# Patient Record
Sex: Female | Born: 1940 | Race: White | Hispanic: No | Marital: Married | Smoking: Current every day smoker
Health system: Southern US, Community
[De-identification: ages and names within clinical notes are randomized; demographics above are authoritative.]

## PROBLEM LIST (undated history)

## (undated) DIAGNOSIS — T8859XA Other complications of anesthesia, initial encounter: Secondary | ICD-10-CM

## (undated) DIAGNOSIS — E039 Hypothyroidism, unspecified: Secondary | ICD-10-CM

## (undated) DIAGNOSIS — I2510 Atherosclerotic heart disease of native coronary artery without angina pectoris: Secondary | ICD-10-CM

## (undated) DIAGNOSIS — E079 Disorder of thyroid, unspecified: Secondary | ICD-10-CM

## (undated) HISTORY — PX: APPENDECTOMY: SHX54

## (undated) HISTORY — PX: ABDOMINAL HYSTERECTOMY: SHX81

---

## 2004-03-09 ENCOUNTER — Ambulatory Visit: Payer: Self-pay | Admitting: Family Medicine

## 2009-06-23 ENCOUNTER — Encounter: Admission: RE | Admit: 2009-06-23 | Discharge: 2009-06-23 | Payer: Self-pay | Admitting: Family Medicine

## 2011-03-23 ENCOUNTER — Ambulatory Visit (INDEPENDENT_AMBULATORY_CARE_PROVIDER_SITE_OTHER): Payer: Medicare Other | Admitting: Urology

## 2011-03-23 DIAGNOSIS — R82998 Other abnormal findings in urine: Secondary | ICD-10-CM

## 2011-03-23 DIAGNOSIS — N952 Postmenopausal atrophic vaginitis: Secondary | ICD-10-CM

## 2011-07-27 ENCOUNTER — Ambulatory Visit (INDEPENDENT_AMBULATORY_CARE_PROVIDER_SITE_OTHER): Payer: Medicare Other | Admitting: Urology

## 2011-07-27 DIAGNOSIS — R300 Dysuria: Secondary | ICD-10-CM

## 2011-07-27 DIAGNOSIS — N952 Postmenopausal atrophic vaginitis: Secondary | ICD-10-CM

## 2011-07-27 DIAGNOSIS — R82998 Other abnormal findings in urine: Secondary | ICD-10-CM

## 2016-06-21 ENCOUNTER — Other Ambulatory Visit (HOSPITAL_COMMUNITY): Payer: Self-pay | Admitting: Family Medicine

## 2016-06-21 DIAGNOSIS — Z780 Asymptomatic menopausal state: Secondary | ICD-10-CM

## 2016-06-30 ENCOUNTER — Ambulatory Visit (HOSPITAL_COMMUNITY)
Admission: RE | Admit: 2016-06-30 | Discharge: 2016-06-30 | Disposition: A | Discharge status: Home / Self Care | Payer: Medicare Other | Source: Ambulatory Visit | Attending: Family Medicine | Admitting: Family Medicine

## 2016-06-30 ENCOUNTER — Encounter (HOSPITAL_COMMUNITY): Payer: Self-pay | Admitting: Radiology

## 2016-06-30 DIAGNOSIS — M810 Age-related osteoporosis without current pathological fracture: Secondary | ICD-10-CM | POA: Insufficient documentation

## 2016-06-30 DIAGNOSIS — Z780 Asymptomatic menopausal state: Secondary | ICD-10-CM | POA: Diagnosis present

## 2016-10-11 ENCOUNTER — Ambulatory Visit (HOSPITAL_COMMUNITY)
Admission: RE | Admit: 2016-10-11 | Discharge: 2016-10-11 | Disposition: A | Discharge status: Home / Self Care | Payer: Medicare Other | Source: Ambulatory Visit | Attending: Family Medicine | Admitting: Family Medicine

## 2016-10-11 ENCOUNTER — Other Ambulatory Visit (HOSPITAL_COMMUNITY): Payer: Self-pay | Admitting: Family Medicine

## 2016-10-11 DIAGNOSIS — M79671 Pain in right foot: Secondary | ICD-10-CM | POA: Insufficient documentation

## 2016-10-11 DIAGNOSIS — R52 Pain, unspecified: Secondary | ICD-10-CM

## 2016-10-11 IMAGING — DX DG FOOT 2V*R*
2 series · 2 of 2 positions shown · non-contrast
Comparison: None in PACs

CLINICAL DATA: Pain and redness along the lateral aspect of the
great toe for the past 5 days.

EXAM:
RIGHT FOOT - 2 VIEW

[foot ap]
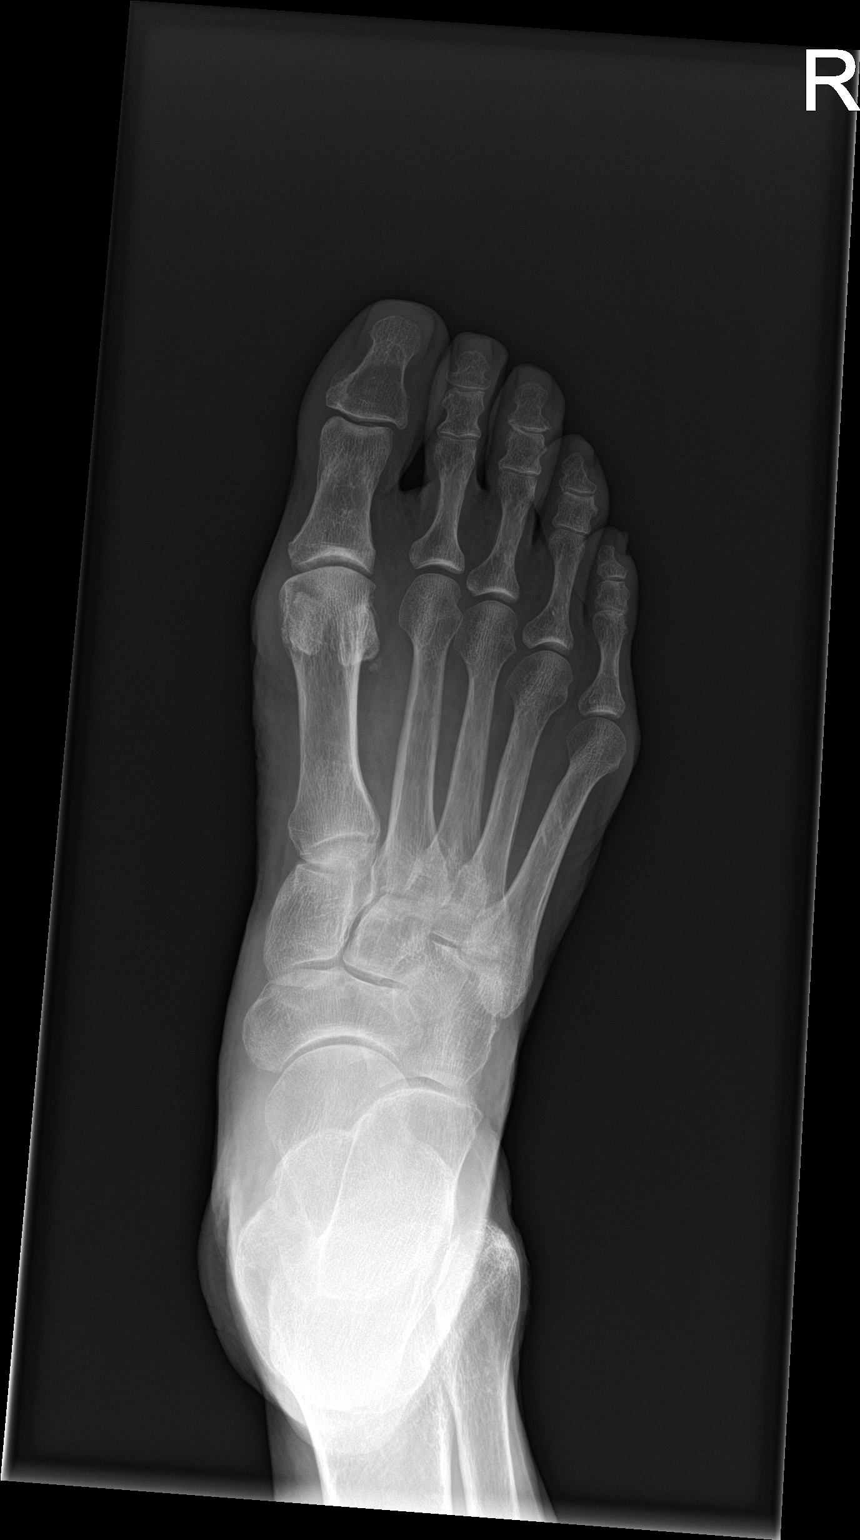

[foot lat]
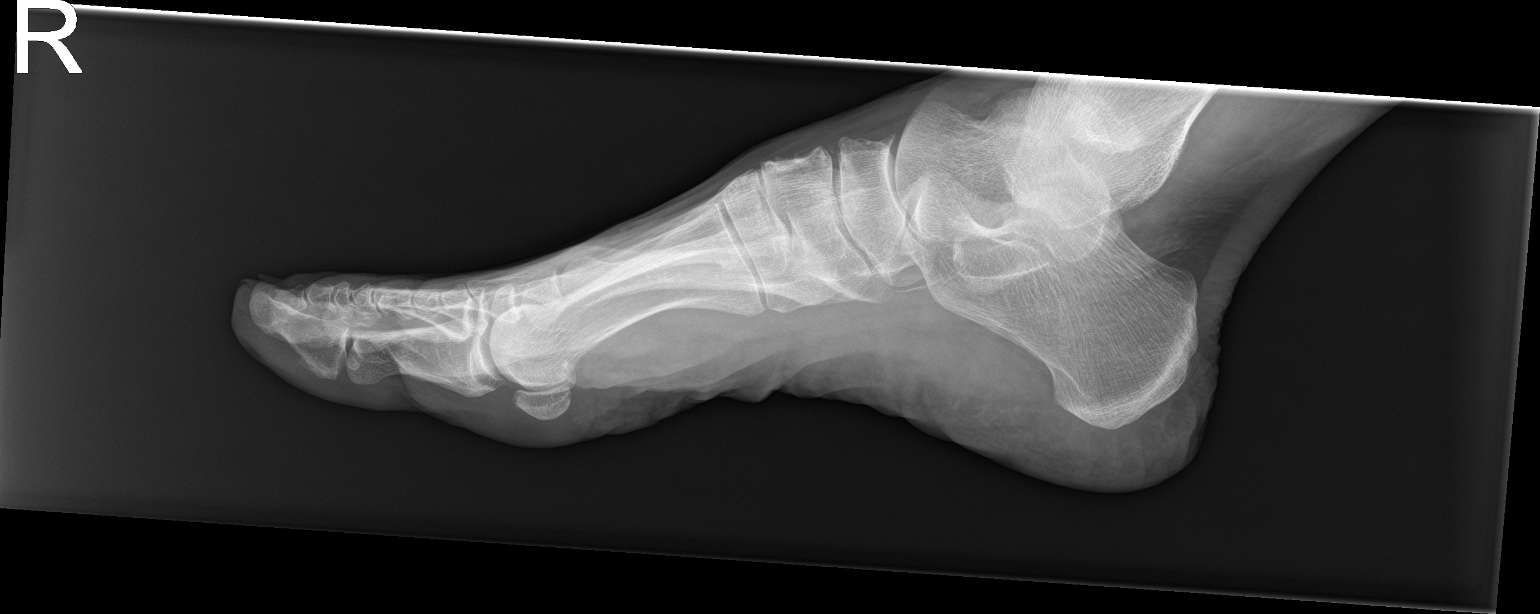

[2 of 2 positions shown; findings below may reference images not displayed]

FINDINGS: The bones are subjectively mildly osteopenic. There is no acute or
healing fracture. The interphalangeal and metatarsophalangeal joint
spaces are well-maintained. Specific attention to the great toe
reveals no acute bony abnormality. The soft tissues of the foot
appear normal.
IMPRESSION: There is no acute or significant chronic bony abnormality of the
right foot.

## 2021-06-13 ENCOUNTER — Emergency Department (HOSPITAL_COMMUNITY): Payer: Medicare Other

## 2021-06-13 ENCOUNTER — Other Ambulatory Visit: Payer: Self-pay

## 2021-06-13 ENCOUNTER — Emergency Department (HOSPITAL_COMMUNITY)
Admission: EM | Admit: 2021-06-13 | Discharge: 2021-06-14 | Disposition: A | Discharge status: Home / Self Care | Payer: Medicare Other | Attending: Emergency Medicine | Admitting: Emergency Medicine

## 2021-06-13 ENCOUNTER — Encounter (HOSPITAL_COMMUNITY): Payer: Self-pay | Admitting: Emergency Medicine

## 2021-06-13 DIAGNOSIS — S22080A Wedge compression fracture of T11-T12 vertebra, initial encounter for closed fracture: Secondary | ICD-10-CM

## 2021-06-13 DIAGNOSIS — R202 Paresthesia of skin: Secondary | ICD-10-CM | POA: Diagnosis not present

## 2021-06-13 DIAGNOSIS — R350 Frequency of micturition: Secondary | ICD-10-CM | POA: Insufficient documentation

## 2021-06-13 DIAGNOSIS — S22089A Unspecified fracture of T11-T12 vertebra, initial encounter for closed fracture: Secondary | ICD-10-CM | POA: Insufficient documentation

## 2021-06-13 DIAGNOSIS — X500XXA Overexertion from strenuous movement or load, initial encounter: Secondary | ICD-10-CM | POA: Diagnosis not present

## 2021-06-13 DIAGNOSIS — K6289 Other specified diseases of anus and rectum: Secondary | ICD-10-CM | POA: Diagnosis not present

## 2021-06-13 DIAGNOSIS — K5900 Constipation, unspecified: Secondary | ICD-10-CM | POA: Diagnosis not present

## 2021-06-13 DIAGNOSIS — S29002A Unspecified injury of muscle and tendon of back wall of thorax, initial encounter: Secondary | ICD-10-CM | POA: Diagnosis present

## 2021-06-13 LAB — URINALYSIS, ROUTINE W REFLEX MICROSCOPIC
Bilirubin Urine: NEGATIVE
Glucose, UA: NEGATIVE mg/dL
Hgb urine dipstick: NEGATIVE
Ketones, ur: NEGATIVE mg/dL
Leukocytes,Ua: NEGATIVE
Nitrite: NEGATIVE
Protein, ur: NEGATIVE mg/dL
Specific Gravity, Urine: 1.005 (ref 1.005–1.030)
pH: 6 (ref 5.0–8.0)

## 2021-06-13 LAB — COMPREHENSIVE METABOLIC PANEL
ALT: 12 U/L (ref 0–44)
AST: 17 U/L (ref 15–41)
Albumin: 4.1 g/dL (ref 3.5–5.0)
Alkaline Phosphatase: 96 U/L (ref 38–126)
Anion gap: 6 (ref 5–15)
BUN: 19 mg/dL (ref 8–23)
CO2: 29 mmol/L (ref 22–32)
Calcium: 9.7 mg/dL (ref 8.9–10.3)
Chloride: 102 mmol/L (ref 98–111)
Creatinine, Ser: 0.88 mg/dL (ref 0.44–1.00)
GFR, Estimated: >60 mL/min (ref >60)
Glucose, Bld: 102 mg/dL — ABNORMAL HIGH (ref 70–99)
Potassium: 4.3 mmol/L (ref 3.5–5.1)
Sodium: 137 mmol/L (ref 135–145)
Total Bilirubin: 0.4 mg/dL (ref 0.3–1.2)
Total Protein: 7.7 g/dL (ref 6.5–8.1)

## 2021-06-13 LAB — CBC
HCT: 41.1 % (ref 36.0–46.0)
Hemoglobin: 13.6 g/dL (ref 12.0–15.0)
MCH: 30.7 pg (ref 26.0–34.0)
MCHC: 33.1 g/dL (ref 30.0–36.0)
MCV: 92.8 fL (ref 80.0–100.0)
Platelets: 399 10*3/uL (ref 150–400)
RBC: 4.43 MIL/uL (ref 3.87–5.11)
RDW: 15.8 % — ABNORMAL HIGH (ref 11.5–15.5)
WBC: 10.6 10*3/uL — ABNORMAL HIGH (ref 4.0–10.5)
nRBC: 0 % (ref 0.0–0.2)

## 2021-06-13 LAB — LIPASE, BLOOD: Lipase: 27 U/L (ref 11–51)

## 2021-06-13 IMAGING — DX DG ABDOMEN 1V
2 series · 2 of 2 positions shown · non-contrast
Comparison: None Available.

CLINICAL DATA: Constipation

EXAM:
ABDOMEN - 1 VIEW

[abdomen kub (1 of 2)]
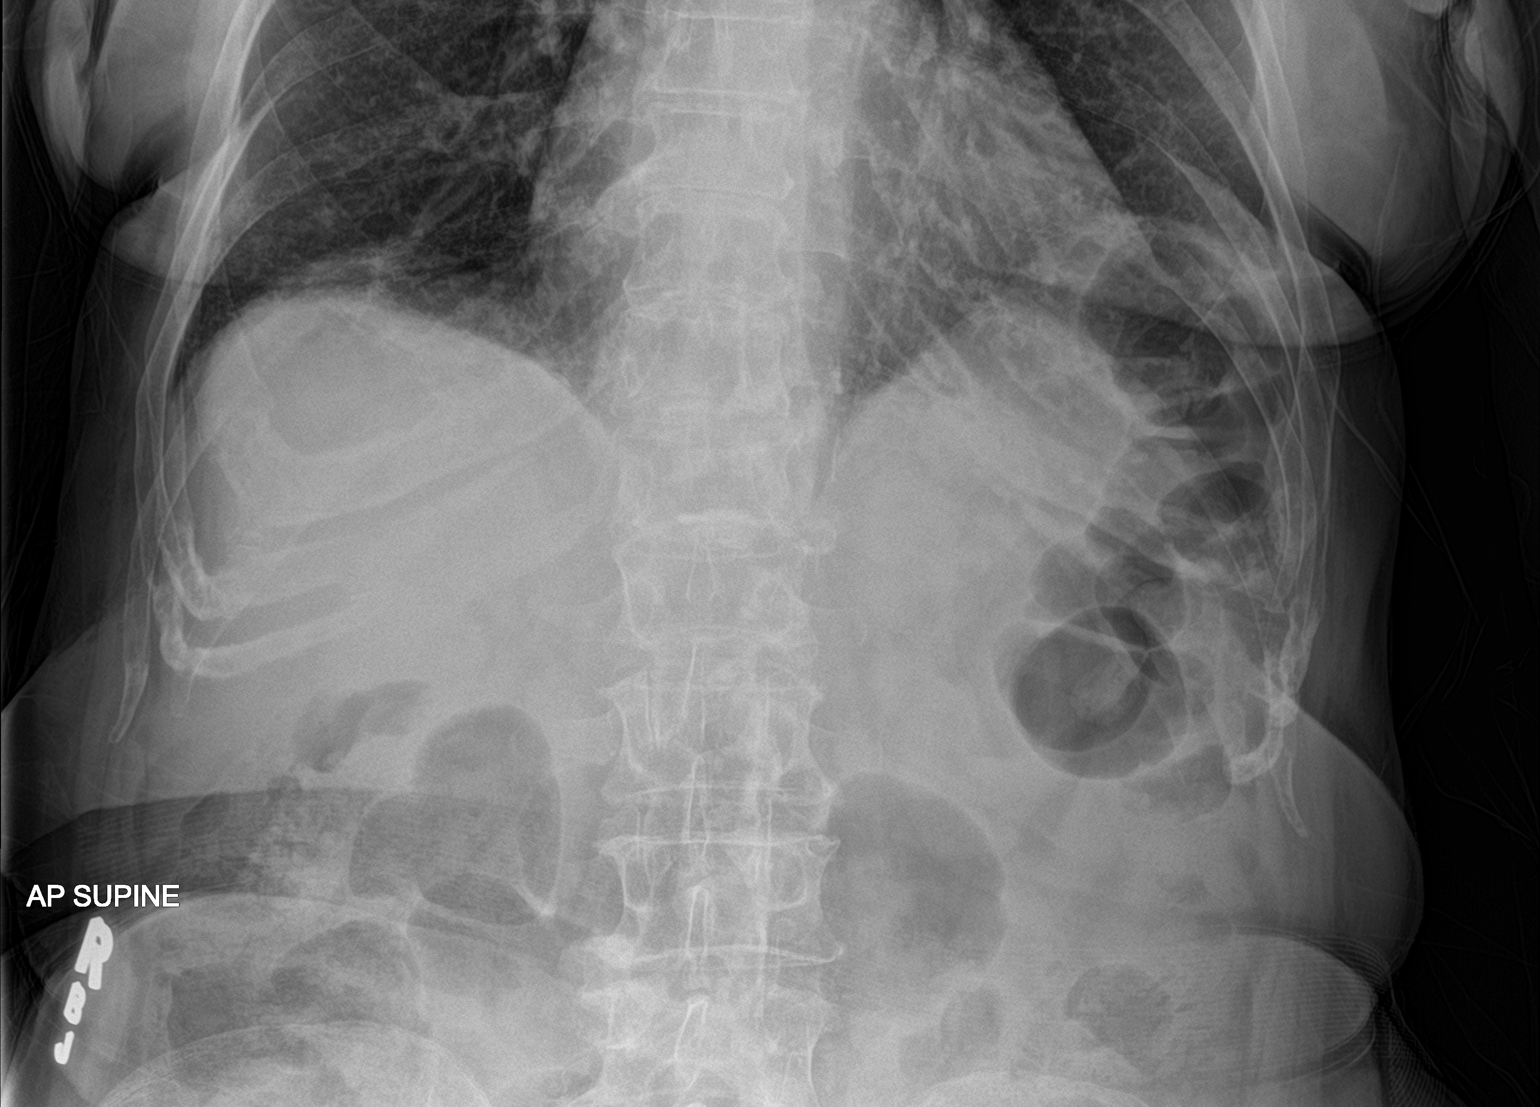

[abdomen kub (2 of 2)]
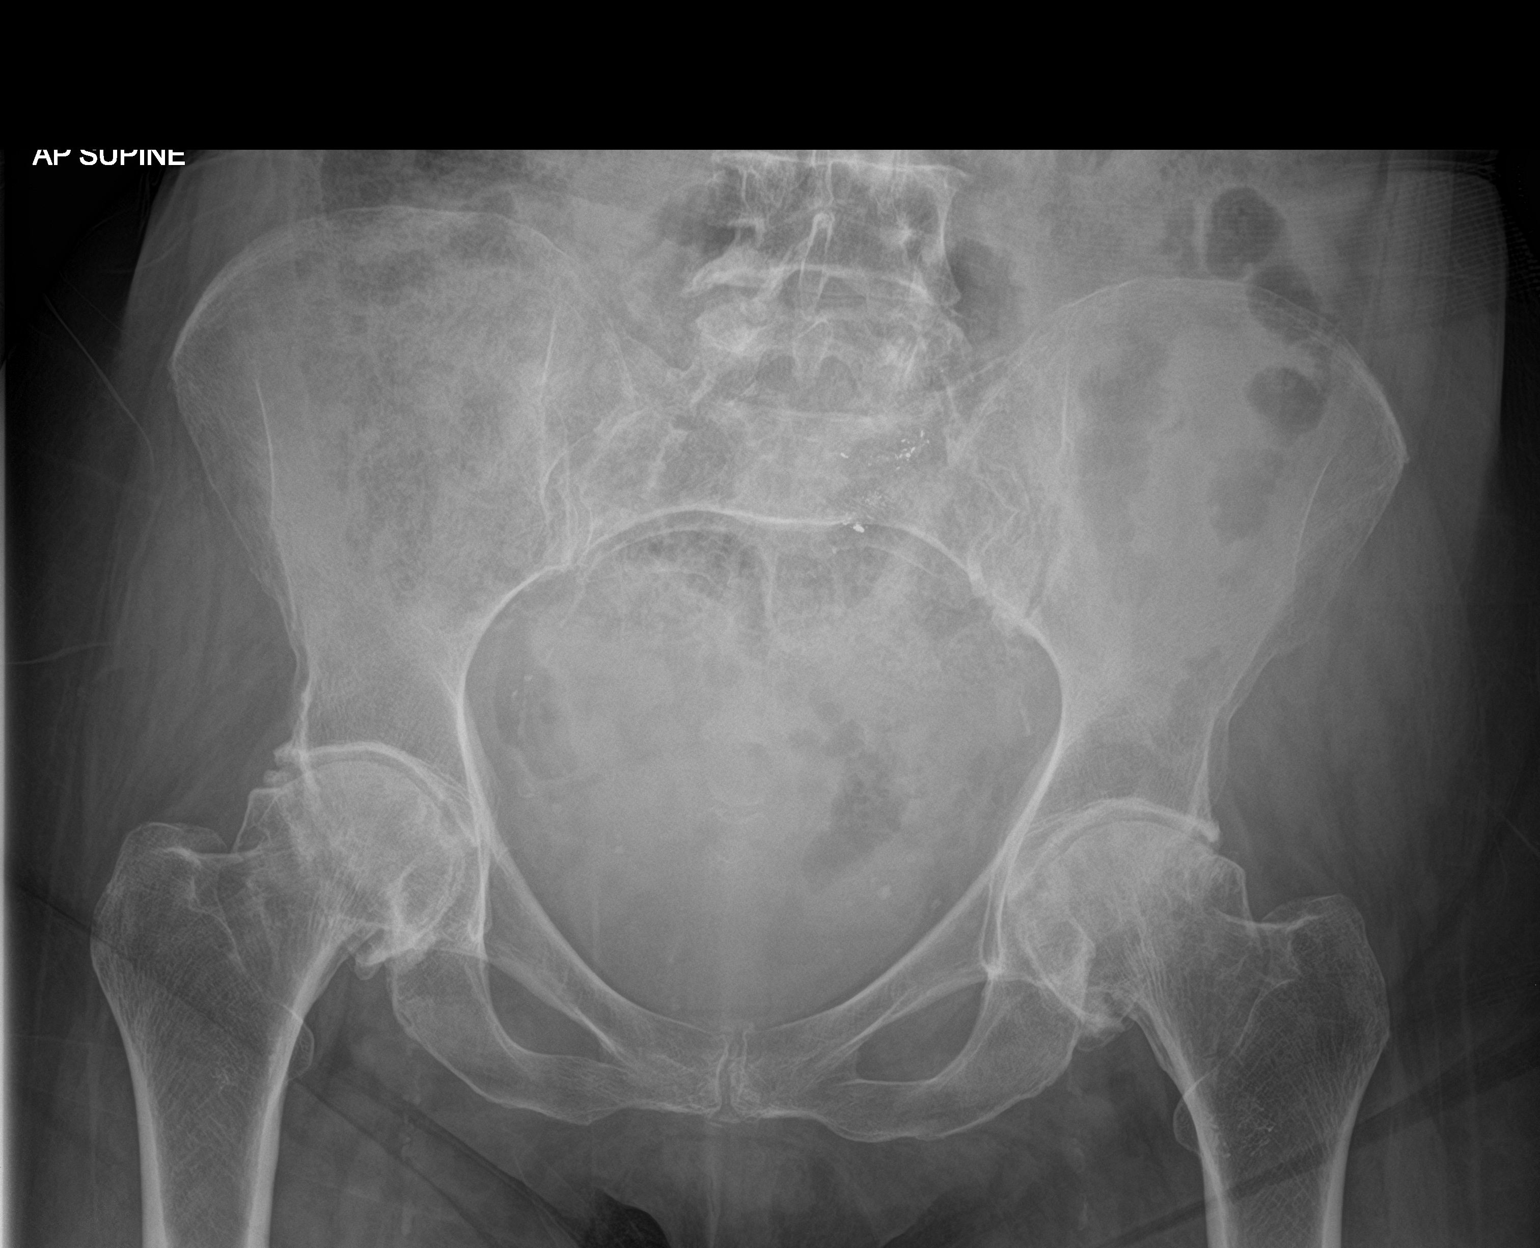

[2 of 2 positions shown; findings below may reference images not displayed]

FINDINGS: Nonobstructive bowel-gas pattern. Large amount of retained fecal
material throughout the colon. Suspicious calcifications identified.
Advanced degenerative changes of the hips noted.
IMPRESSION: Large amount of retained fecal material throughout the colon.

## 2021-06-13 IMAGING — CT CT L SPINE W/O CM
3 of 4 series · 11 of 33 positions shown, 13 images · non-contrast
Comparison: None Available.

CLINICAL DATA: Lumbar radiculopathy.



[Series 4: l spine soft · axial · 0.38mm/px · z∈[-355,-187]mm · 3 of 138 slices shown, 4 images]
[im 32/138  soft-tissue]
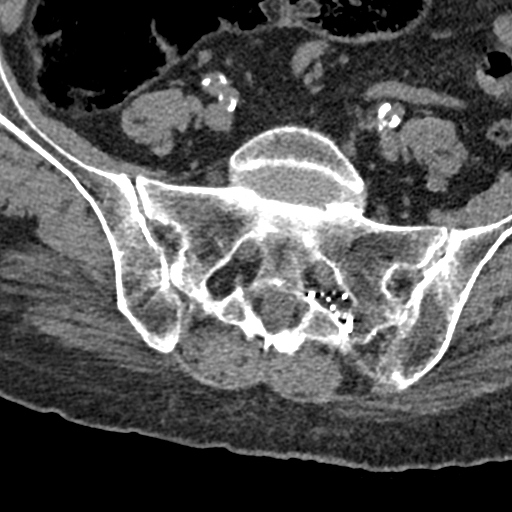
[im 32/138  bone]
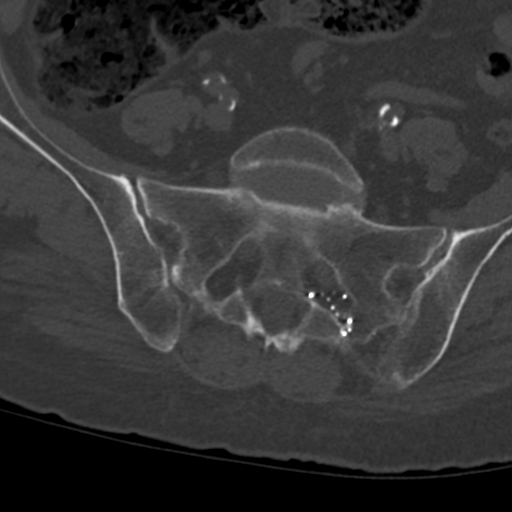
[im 74/138  bone]
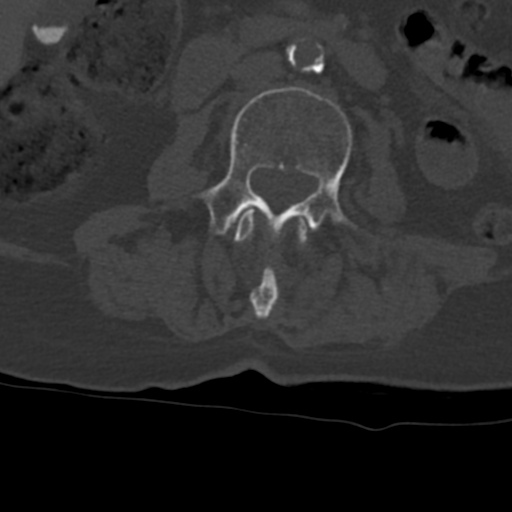
[im 116/138  bone]
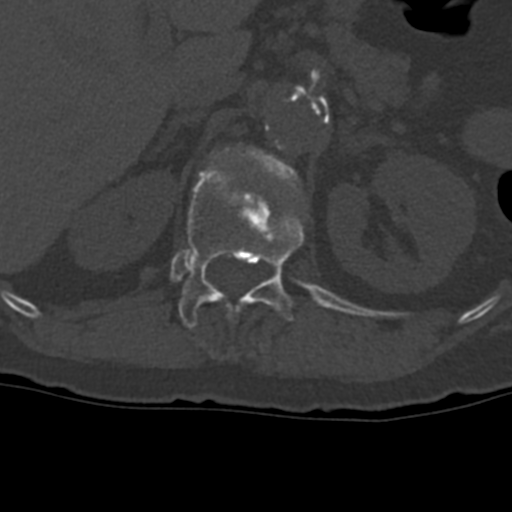

[Series 8: coronal bone · coronal · 0.35mm/px · 3 of 112 slices shown]
[im 32/112  bone]
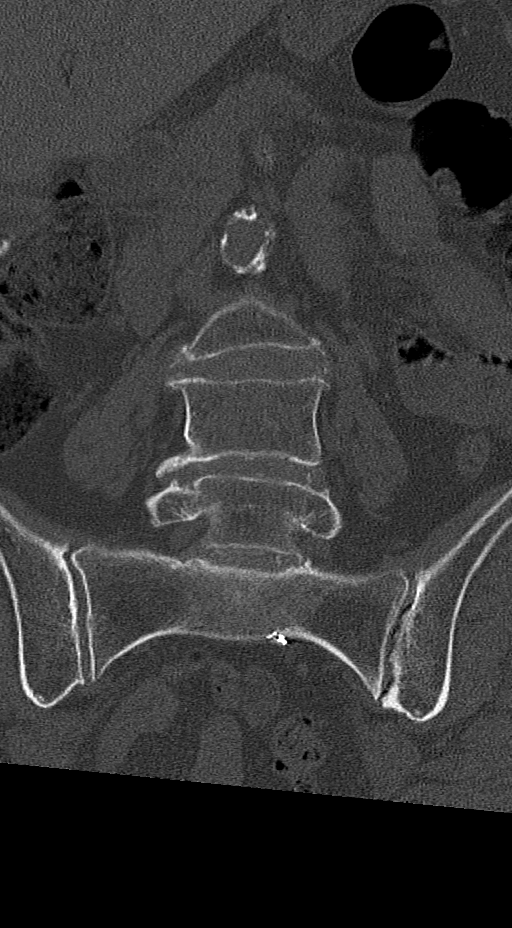
[im 48/112  bone]
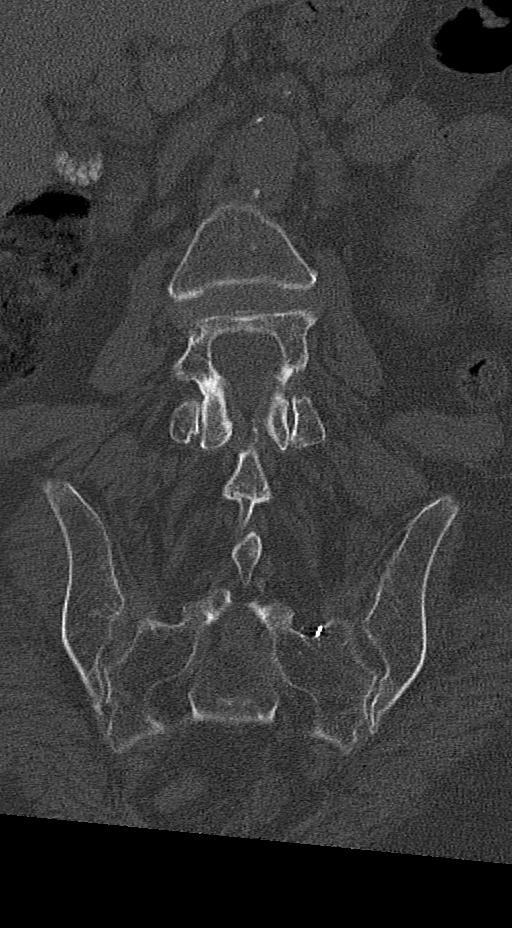
[im 64/112  bone]
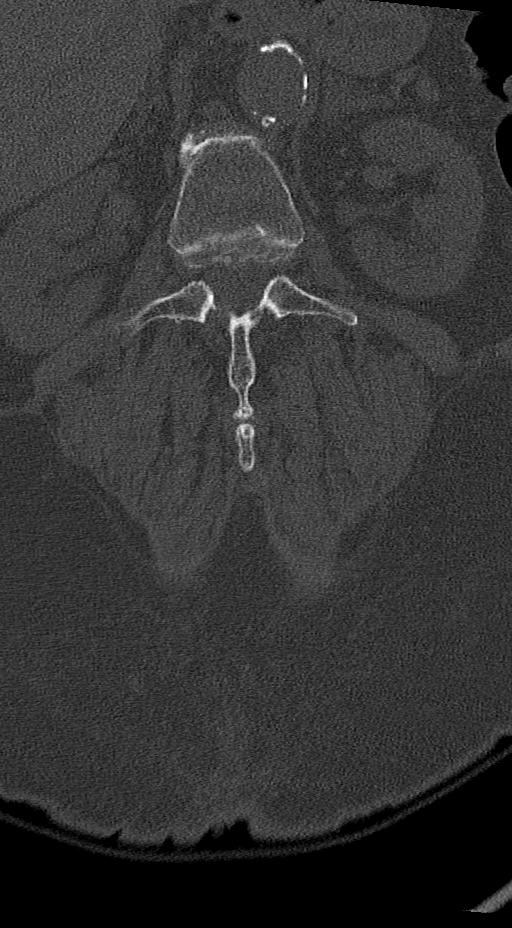

[Series 9: l spine soft sagittal · sagittal · 0.42mm/px · 5 of 74 slices shown, 6 images]
[im 25/74  bone]
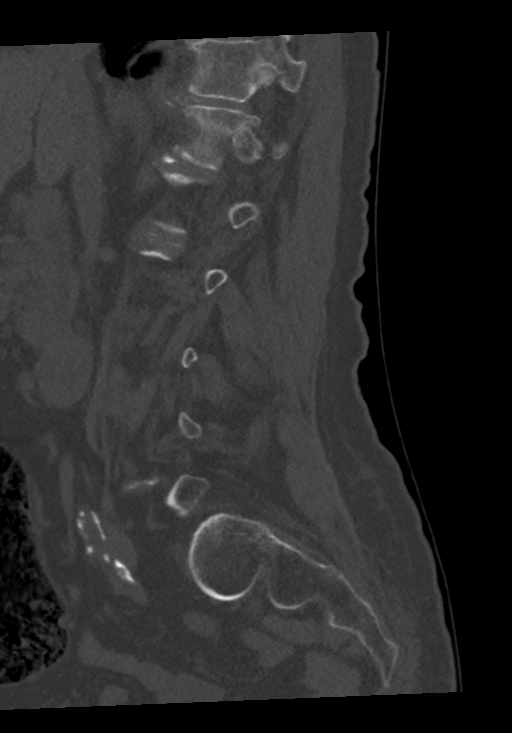
[im 31/74  bone]
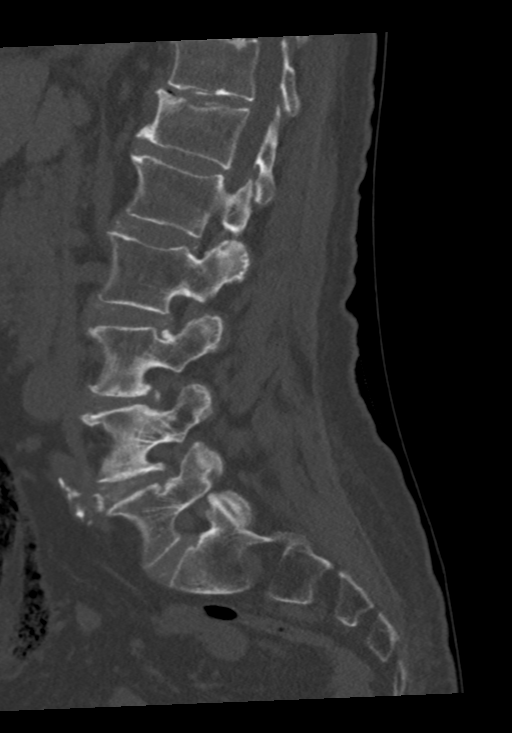
[im 37/74  soft-tissue]
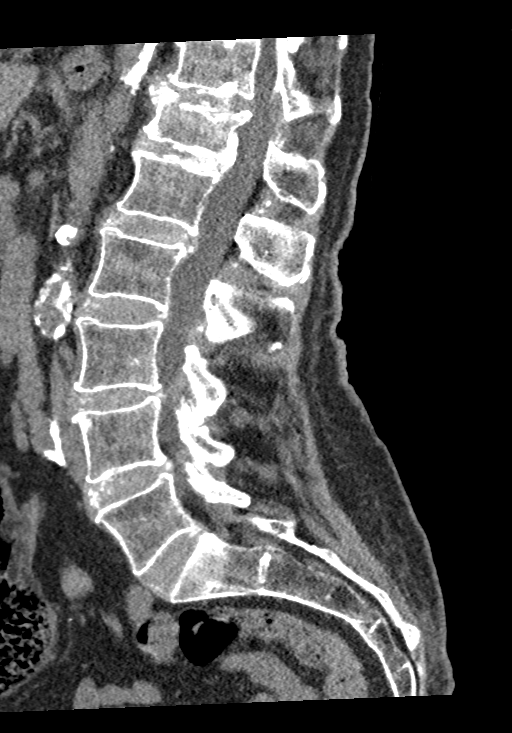
[im 37/74  bone]
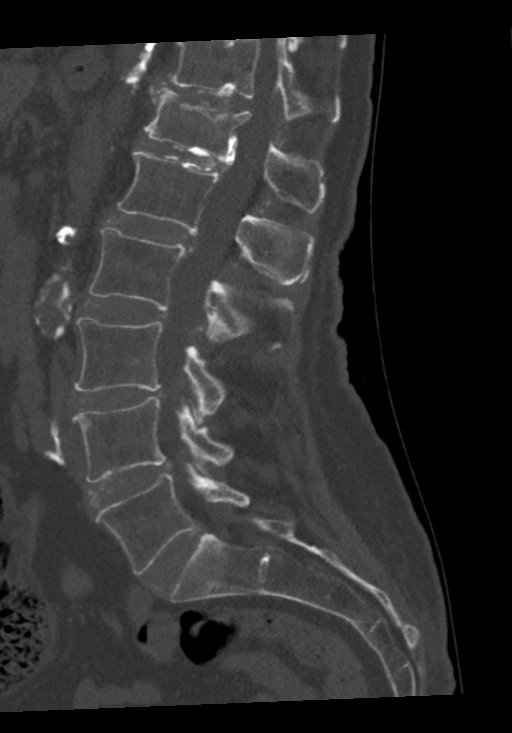
[im 43/74  bone]
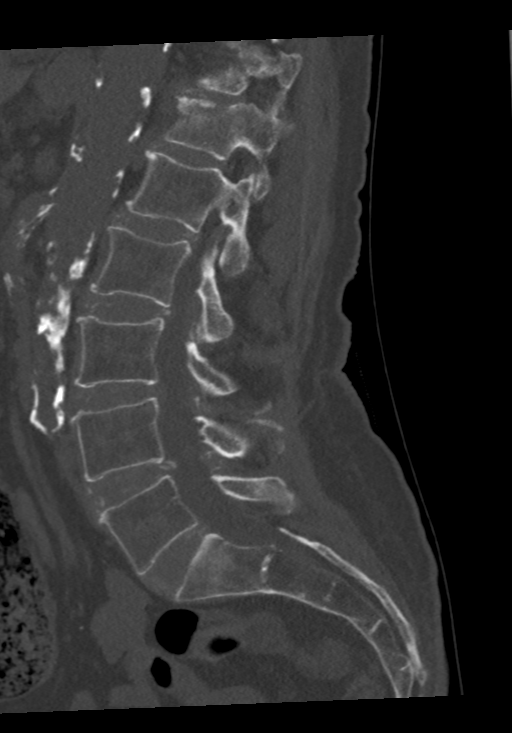
[im 49/74  bone]
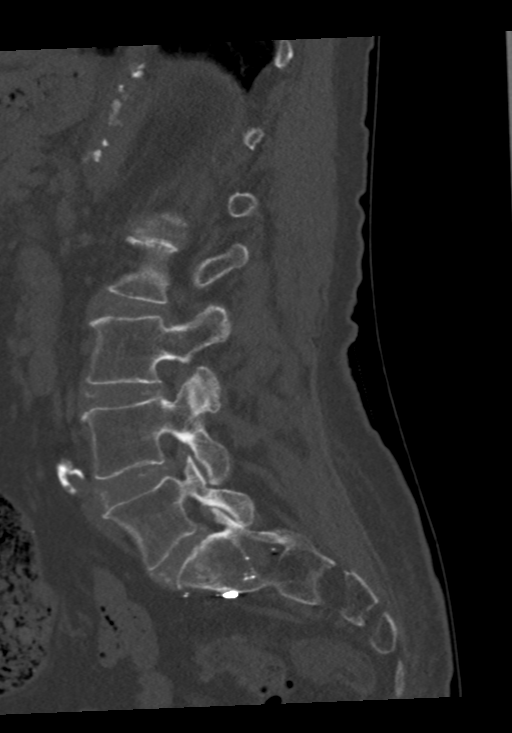

[11 of 33 positions shown; findings below may reference images not displayed]

FINDINGS: Segmentation: There are five lumbar type vertebral bodies. The last
full intervertebral disc space is labeled L5-S1.

Alignment: Normal

Vertebrae: The lumbar vertebral bodies are intact. No acute fracture
or bone lesion. Disc spaces are preserved. Multilevel intervertebral
disc calcifications are noted. There is a compression deformity of
the T12 vertebral body which appears to be an acute or subacute
compression fracture. Small amount of paraspinal hematoma noted.

Paraspinal and other soft tissues: Small amount of paraspinal
hematoma noted at T12. Advanced atherosclerotic calcifications
involving the aorta and branch vessels but no aneurysm.
Cholelithiasis is noted. Hyperdense/hemorrhagic cyst associated with
the upper pole region of the left kidney.

Disc levels: T11-12: Osteophytic ridging but no significant
retropulsion or canal compromise.

T12-L1: Intervertebral disc calcifications are noted. Is also
ossification of the posterior longitudinal ligament. No significant
spinal or foraminal stenosis.

L1-2: Mild annular bulge and moderate facet disease but no
significant spinal or foraminal stenosis.

L2-3: Mild annular bulge and moderate facet disease but no focal
disc protrusions, spinal or foraminal stenosis. Moderate
calcification of the lamina noted.

L3-4: Diffuse bulging annulus and partial calcification of the disc.
There is also ligamentum flavum thickening and partial
calcification. The combination contributes to moderate spinal and
bilateral lateral recess stenosis. No foraminal stenosis.

L4-5: Bulging partially calcified disc, facet disease and ligamentum
flavum thickening with calcifications contributing to moderate
spinal and bilateral lateral recess stenosis. No foraminal stenosis.

L5-S1: No significant findings.
IMPRESSION: 1. Acute or subacute T12 compression fracture with small amount of
paraspinal hematoma. No retropulsion or canal compromise.
2. No lumbar fractures are identified.
3. Moderate multifactorial spinal and bilateral lateral recess
stenosis at L3-4 and L4-5.
4. Advanced vascular disease.

Aortic Atherosclerosis ([Y8]-[Y8]).

## 2021-06-13 MED ORDER — OXYCODONE-ACETAMINOPHEN 5-325 MG PO TABS
1.0000 | ORAL_TABLET | Freq: Once | ORAL | Status: AC
  Administered 2021-06-13: 1 via ORAL
  Filled 2021-06-13: qty 1

## 2021-06-13 MED ORDER — FENTANYL CITRATE PF 50 MCG/ML IJ SOSY
50.0000 ug | PREFILLED_SYRINGE | Freq: Once | INTRAMUSCULAR | Status: AC
  Administered 2021-06-13: 50 ug via INTRAVENOUS
  Filled 2021-06-13: qty 1

## 2021-06-13 NOTE — ED Triage Notes (Addendum)
Patient c/o constipation with lower back pain. Per patient has not had BM since Monday and usually has a BM daily. Patient recently placed on gabapentin for shingles. Patient seen chiropractor on Monday and had x-ray, per chiropractor patient has "slipped disc pressing on sciatic nerve. Patient reports using ice and laxatives with no relief for constipation or back pain.  ?

## 2021-06-13 NOTE — ED Notes (Signed)
Pt arrive from [facility] ED for MRI of her back.  ?

## 2021-06-13 NOTE — ED Provider Triage Note (Signed)
Emergency Medicine Provider Triage Evaluation Note ? ?Gloria Massey , a 81 y.o. female  was evaluated in triage.  Pt complains of left-sided back pain, left leg radiation for multiple weeks despite treatment, as well as constipation since Monday with feeling that she needs to have a bowel movement, Dulcolax x4, glycerin suppository with no relief.  Patient denies nausea, vomiting.  She reports that she is taking Aleve, gabapentin for her back pain with minimal relief, recently had a shingles infection on the upper thigh on the left as well.  She is not taking any narcotic pain medication at this time.  She has had an appendectomy, abdominal hysterectomy in the past.  No history of prior bowel obstructions. ? ?Review of Systems  ?Positive: Back pain, radiating pain into the leg, buttocks, constipation ?Negative: Nausea, vomiting, diarrhea, fever, chills ? ?Physical Exam  ?BP (!) 148/94 (BP Location: Right Arm)   Pulse 73   Temp 97.8 ?F (36.6 ?C) (Oral)   Resp 17   Ht 5\' 4"  (1.626 m)   Wt 59.9 kg   SpO2 98%   BMI 22.66 kg/m?  ?Gen:   Awake, no distress   ?Resp:  Normal effort  ?MSK:   Moves extremities without difficulty  ?Other:  Patient wearing back brace but has some tenderness to palpation in the midline lumbar spine, radiating to the paraspinous muscles of the lumbar spine.  She has some decreased strength to flexion extension of the hips bilaterally, likely secondary to effort, pain, no unilateral discrepancy noted. ? ?Medical Decision Making  ?Medically screening exam initiated at 1:54 PM.  Appropriate orders placed.  ELIANNY BUXBAUM was informed that the remainder of the evaluation will be completed by another provider, this initial triage assessment does not replace that evaluation, and the importance of remaining in the ED until their evaluation is complete. ? ?Work-up initiated ?  ?Gloria Haber, PA-C ?06/13/21 1357 ? ?

## 2021-06-13 NOTE — ED Provider Notes (Signed)
?[facility] ?Provider Note ? ? ?CSN: [id number] ?Arrival date & time: 06/13/21  1311 ? ?  ? ?History ? ?Chief Complaint  ?Patient presents with  ? Constipation  ? ? ?Gloria HaberCarolyn J Massey is a 81 y.o. female with no significant past medical history presenting with problems including constipation and low back pain.  She describes having low back pain for the past month, denies any specific injuries although does recall lifting a 24 pack of [employer]eps[employer] after which her pain developed.  She has been seen by her primary provider several times for this problem and has been treated for sciatica as she does endorse some numbness in the left lateral leg, has been treated with gabapentin and Aleve which has not relieved her symptoms.  Of note, she also had a recent shingles infection of her left upper thigh as well but this has predominantly resolved.  She denies abdominal pain and rectal pain but has severe pain across her lower back.  She has been unable to have a bowel movement x6 days.  She was seen by chiropractor this week but has obtained no relief from these treatments as well.  She has been told she has a injured disc in her lower back.  She denies urinary or fecal incontinence, she does endorse increased urinary frequency but denies loss of control of her bladder.  She has found no alleviators for her symptoms.  She also denies fevers or chills, she does have pain that radiates into the left leg and through her left buttock. ? ?The history is provided by the patient and a relative.  ? ?  ? ?Home Medications ?Prior to Admission medications   ?Not on File  ?   ? ?Allergies    ?Codeine   ? ?Review of Systems   ?Review of Systems  ?Constitutional:  Negative for fever.  ?Respiratory:  Negative for shortness of breath.   ?Cardiovascular:  Negative for chest pain and leg swelling.  ?Gastrointestinal:  Negative for abdominal distention, abdominal pain and constipation.  ?Genitourinary:  Negative for difficulty  urinating, dysuria, flank pain, frequency and urgency.  ?Musculoskeletal:  Positive for back pain. Negative for gait problem and joint swelling.  ?Skin:  Negative for rash.  ?Neurological:  Positive for weakness and numbness.  ? ?Physical Exam ?Updated Vital Signs ?BP (!) 158/53   Pulse 61   Temp 97.8 ?F (36.6 ?C) (Oral)   Resp 17   Ht 5\' 4"  (1.626 m)   Wt 59.9 kg   SpO2 98%   BMI 22.66 kg/m?  ?Physical Exam ?Vitals and nursing note reviewed. Exam conducted with a chaperone present.  ?Constitutional:   ?   Appearance: She is well-developed.  ?HENT:  ?   Head: Normocephalic and atraumatic.  ?Eyes:  ?   Conjunctiva/sclera: Conjunctivae normal.  ?Cardiovascular:  ?   Rate and Rhythm: Normal rate and regular rhythm.  ?   Heart sounds: Normal heart sounds.  ?Pulmonary:  ?   Effort: Pulmonary effort is normal.  ?   Breath sounds: Normal breath sounds. No wheezing.  ?Abdominal:  ?   General: Bowel sounds are normal.  ?   Palpations: Abdomen is soft.  ?   Tenderness: There is no abdominal tenderness. There is no guarding or rebound.  ?Genitourinary: ?   Rectum: Guaiac result negative. No tenderness. Abnormal anal tone.  ?   Comments: Weak but present rectal tone.  No stool impaction.  Decreased sensation to touch left perineum.  Right normal  sensation. ?Musculoskeletal:     ?   General: Normal range of motion.  ?   Cervical back: Normal range of motion.  ?   Thoracic back: Bony tenderness present. No tenderness.  ?   Lumbar back: Bony tenderness present. No tenderness.  ?Skin: ?   General: Skin is warm and dry.  ?Neurological:  ?   Mental Status: She is alert.  ?   Comments: Pt unable to stand due to back pain.  She can flex/ext ankles and knees without deficit, but elicits severe back pain.   ? ? ?ED Results / Procedures / Treatments   ?Labs ?(all labs ordered are listed, but only abnormal results are displayed) ?Labs Reviewed  ?CBC - Abnormal; Notable for the following components:  ?    Result Value  ? WBC 10.6 (*)    ? RDW 15.8 (*)   ? All other components within normal limits  ?COMPREHENSIVE METABOLIC PANEL - Abnormal; Notable for the following components:  ? Glucose, Bld 102 (*)   ? All other components within normal limits  ?LIPASE, BLOOD  ?URINALYSIS, ROUTINE W REFLEX MICROSCOPIC  ? ? ?EKG ?None ? ?Radiology ?DG Abdomen 1 View ? ?Result Date: 06/13/2021 ?CLINICAL DATA:  Constipation EXAM: ABDOMEN - 1 VIEW COMPARISON:  None Available. FINDINGS: Nonobstructive bowel-gas pattern. Large amount of retained fecal material throughout the colon. Suspicious calcifications identified. Advanced degenerative changes of the hips noted. IMPRESSION: Large amount of retained fecal material throughout the colon. Electronically Signed   By: Jannifer Hick M.D.   On: 06/13/2021 14:19  ? ?CT Lumbar Spine Wo Contrast ? ?Result Date: 06/13/2021 ?CLINICAL DATA:  Lumbar radiculopathy. EXAM: CT LUMBAR SPINE WITHOUT CONTRAST TECHNIQUE: Multidetector CT imaging of the lumbar spine was performed without intravenous contrast administration. Multiplanar CT image reconstructions were also generated. RADIATION DOSE REDUCTION: This exam was performed according to the departmental dose-optimization program which includes automated exposure control, adjustment of the mA and/or kV according to patient size and/or use of iterative reconstruction technique. COMPARISON:  None Available. FINDINGS: Segmentation: There are five lumbar type vertebral bodies. The last full intervertebral disc space is labeled L5-S1. Alignment: Normal Vertebrae: The lumbar vertebral bodies are intact. No acute fracture or bone lesion. Disc spaces are preserved. Multilevel intervertebral disc calcifications are noted. There is a compression deformity of the T12 vertebral body which appears to be an acute or subacute compression fracture. Small amount of paraspinal hematoma noted. Paraspinal and other soft tissues: Small amount of paraspinal hematoma noted at T12. Advanced  atherosclerotic calcifications involving the aorta and branch vessels but no aneurysm. Cholelithiasis is noted. Hyperdense/hemorrhagic cyst associated with the upper pole region of the left kidney. Disc levels: T11-12: Osteophytic ridging but no significant retropulsion or canal compromise. T12-L1: Intervertebral disc calcifications are noted. Is also ossification of the posterior longitudinal ligament. No significant spinal or foraminal stenosis. L1-2: Mild annular bulge and moderate facet disease but no significant spinal or foraminal stenosis. L2-3: Mild annular bulge and moderate facet disease but no focal disc protrusions, spinal or foraminal stenosis. Moderate calcification of the lamina noted. L3-4: Diffuse bulging annulus and partial calcification of the disc. There is also ligamentum flavum thickening and partial calcification. The combination contributes to moderate spinal and bilateral lateral recess stenosis. No foraminal stenosis. L4-5: Bulging partially calcified disc, facet disease and ligamentum flavum thickening with calcifications contributing to moderate spinal and bilateral lateral recess stenosis. No foraminal stenosis. L5-S1: No significant findings. IMPRESSION: 1. Acute or subacute T12 compression fracture with  small amount of paraspinal hematoma. No retropulsion or canal compromise. 2. No lumbar fractures are identified. 3. 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 4. Advanced vascular disease. Aortic Atherosclerosis (ICD10-I70.0). Electronically Signed   By: Rudie Meyer M.D.   On: 06/13/2021 16:17   ? ?Procedures ?Procedures  ? ? ?Medications Ordered in ED ?Medications  ?oxyCODONE-acetaminophen (PERCOCET/ROXICET) 5-325 MG per tablet 1 tablet (1 tablet Oral Given 06/13/21 1547)  ? ? ?ED Course/ Medical Decision Making/ A&P ?  ?                        ?Medical Decision Making ?Patient with low back pain and constipation, reduced but not completely  absent rectal tone but concerning for possible developing cauda equina I.  She has a T12 compression fracture with hematoma noted on CT imaging.  We discussed admission for pain management and MRI for further work-up of

## 2021-06-13 NOTE — ED Notes (Signed)
Pt's belongings given to family member. Clothes (shirt and bra), watch, and two rings.  ?

## 2021-06-13 NOTE — ED Provider Notes (Signed)
Patient care assumed in transfer from [facility] hospital for MRI. ? ?In summation 81 year old here for evaluation of low back pain x 1 month. Also noted to have constipation. See previous provider note. Had rectal tone however possible decreased, no rectal impaction on previous provider exam. Rec admission at [facility] for pain control and further workup however patient deferred admission at that time. Plan on MR thoracic and lumbar here with dispo pending results. No abd pain. ? ?Patient admits to some persist pain here in ED. Will provider. ?Physical Exam  ?BP 130/69 (BP Location: Right Arm)   Pulse (!) 59   Temp 98.1 ?F (36.7 ?C) (Oral)   Resp 18   Ht 5\' 4"  (1.626 m)   Wt 59.9 kg   SpO2 96%   BMI 22.66 kg/m?  ? ?Physical Exam ?Vitals and nursing note reviewed.  ?Constitutional:   ?   General: She is not in acute distress. ?   Appearance: She is well-developed. She is not ill-appearing, toxic-appearing or diaphoretic.  ?HENT:  ?   Head: Atraumatic.  ?Eyes:  ?   Pupils: Pupils are equal, round, and reactive to light.  ?Cardiovascular:  ?   Rate and Rhythm: Normal rate.  ?Pulmonary:  ?   Effort: No respiratory distress.  ?Abdominal:  ?   General: Bowel sounds are normal. There is no distension.  ?   Palpations: Abdomen is soft.  ?   Tenderness: There is no abdominal tenderness. There is no right CVA tenderness, left CVA tenderness, guarding or rebound.  ?Musculoskeletal:     ?   General: Normal range of motion.  ?   Cervical back: Normal range of motion.  ?Skin: ?   General: Skin is warm and dry.  ?Neurological:  ?   General: No focal deficit present.  ?   Mental Status: She is alert.  ?Psychiatric:     ?   Mood and Affect: Mood normal.  ? ? ?Procedures  ?Procedures ?Labs Reviewed  ?CBC - Abnormal; Notable for the following components:  ?    Result Value  ? WBC 10.6 (*)   ? RDW 15.8 (*)   ? All other components within normal limits  ?COMPREHENSIVE METABOLIC PANEL - Abnormal; Notable for the following components:  ?  Glucose, Bld 102 (*)   ? All other components within normal limits  ?LIPASE, BLOOD  ?URINALYSIS, ROUTINE W REFLEX MICROSCOPIC  ? ?DG Abdomen 1 View ? ?Result Date: 06/13/2021 ?CLINICAL DATA:  Constipation EXAM: ABDOMEN - 1 VIEW COMPARISON:  None Available. FINDINGS: Nonobstructive bowel-gas pattern. Large amount of retained fecal material throughout the colon. Suspicious calcifications identified. Advanced degenerative changes of the hips noted. IMPRESSION: Large amount of retained fecal material throughout the colon. Electronically Signed   By: Ofilia Neas M.D.   On: 06/13/2021 14:19  ? ?CT Lumbar Spine Wo Contrast ? ?Result Date: 06/13/2021 ?CLINICAL DATA:  Lumbar radiculopathy. EXAM: CT LUMBAR SPINE WITHOUT CONTRAST TECHNIQUE: Multidetector CT imaging of the lumbar spine was performed without intravenous contrast administration. Multiplanar CT image reconstructions were also generated. RADIATION DOSE REDUCTION: This exam was performed according to the departmental dose-optimization program which includes automated exposure control, adjustment of the mA and/or kV according to patient size and/or use of iterative reconstruction technique. COMPARISON:  None Available. FINDINGS: Segmentation: There are five lumbar type vertebral bodies. The last full intervertebral disc space is labeled L5-S1. Alignment: Normal Vertebrae: The lumbar vertebral bodies are intact. No acute fracture or bone lesion. Disc spaces are  preserved. Multilevel intervertebral disc calcifications are noted. There is a compression deformity of the T12 vertebral body which appears to be an acute or subacute compression fracture. Small amount of paraspinal hematoma noted. Paraspinal and other soft tissues: Small amount of paraspinal hematoma noted at T12. Advanced atherosclerotic calcifications involving the aorta and branch vessels but no aneurysm. Cholelithiasis is noted. Hyperdense/hemorrhagic cyst associated with the upper pole region of the  left kidney. Disc levels: T11-12: Osteophytic ridging but no significant retropulsion or canal compromise. T12-L1: Intervertebral disc calcifications are noted. Is also ossification of the posterior longitudinal ligament. No significant spinal or foraminal stenosis. L1-2: Mild annular bulge and moderate facet disease but no significant spinal or foraminal stenosis. L2-3: Mild annular bulge and moderate facet disease but no focal disc protrusions, spinal or foraminal stenosis. Moderate calcification of the lamina noted. L3-4: Diffuse bulging annulus and partial calcification of the disc. There is also ligamentum flavum thickening and partial calcification. The combination contributes to moderate spinal and bilateral lateral recess stenosis. No foraminal stenosis. L4-5: Bulging partially calcified disc, facet disease and ligamentum flavum thickening with calcifications contributing to moderate spinal and bilateral lateral recess stenosis. No foraminal stenosis. L5-S1: No significant findings. IMPRESSION: 1. Acute or subacute T12 compression fracture with small amount of paraspinal hematoma. No retropulsion or canal compromise. 2. No lumbar fractures are identified. 3. 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 4. Advanced vascular disease. Aortic Atherosclerosis (ICD10-I70.0). Electronically Signed   By: Marijo Sanes M.D.   On: 06/13/2021 16:17    ?ED Course / MDM  ?  ? ?Patient is seen in transfer from [facility] for [place].  Sounds like she has had lower back pain for 1 month.  Had CT scan at [facility] which showed T12 compression fracture as well as possible hematoma. Some decrease in rectal tone by previous providers exam. Offered admission at [facility] for pain control and MR however patient deferred admission and elected for tx here for MR. Plan to FU on MR and dispo appropriately. [place] abd exam currently. ? ? ?Care transferred to oncoming provider who will FU on MR results  and determine dispo. ? ?Medical Decision Making ?Amount and/or Complexity of Data Reviewed ?Independent Historian:  ?   Details: son ?External Data Reviewed: labs, radiology and notes. ?Labs: ordered. Decision-making details documented in ED Course. ?Radiology: ordered and independent interpretation performed. Decision-making details documented in ED Course. ? ?Risk ?OTC drugs. ?Prescription drug management. ?Parenteral controlled substances. ?Diagnosis or treatment significantly limited by social determinants of health. ? ? ? ? ? ? ? ?  ?Byan Poplaski A, PA-C ?06/13/21 2311 ? ?  ?Elnora Morrison, MD ?06/13/21 2332 ? ?

## 2021-06-14 ENCOUNTER — Emergency Department (HOSPITAL_COMMUNITY): Payer: Medicare Other

## 2021-06-14 IMAGING — MR MR LUMBAR SPINE W/O CM
4 of 5 series · 25 of 48 positions shown · non-contrast
Comparison: Prior CT from [DATE].

CLINICAL DATA: Initial evaluation for acute back pain, known
compression fracture.

EXAM:
MRI THORACIC AND LUMBAR SPINE WITHOUT CONTRAST
TECHNIQUE: Multiplanar and multiecho pulse sequences of the thoracic and lumbar
spine were obtained without intravenous contrast.

[Series 24: T2 · sagittal · 4.0mm · 0.73mm/px · 7 of 16 slices shown (1 of 2)]
[im 1/16]
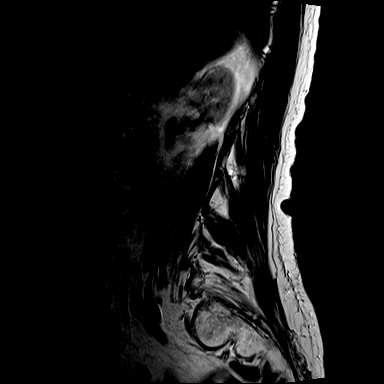
[im 3/16]
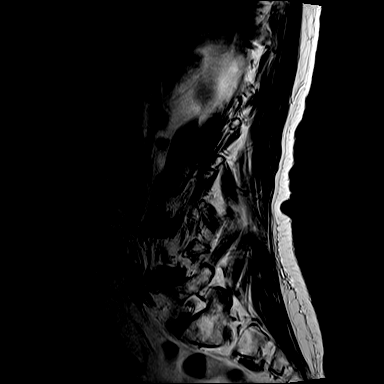
[im 6/16]
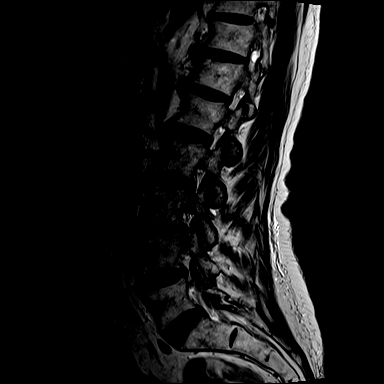
[im 8/16]
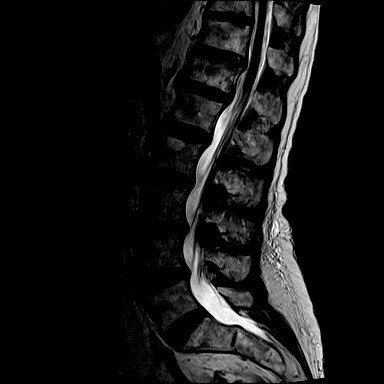
[im 11/16]
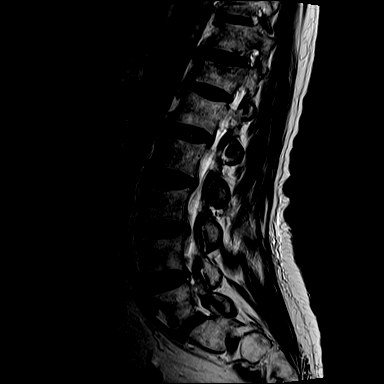
[im 13/16]
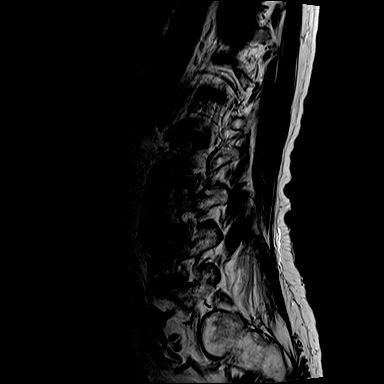
[im 16/16]
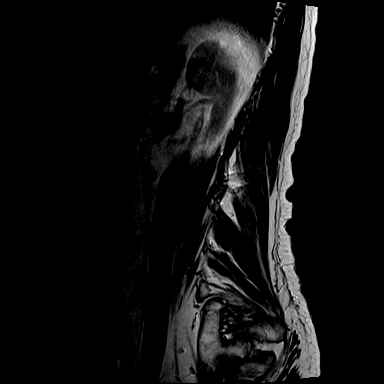

[Series 26: T1 · sagittal · 4.0mm · 0.88mm/px · 6 of 16 slices shown (1 of 2)]
[im 1/16]
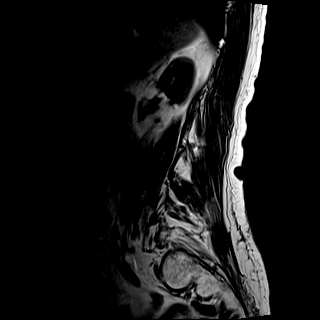
[im 4/16]
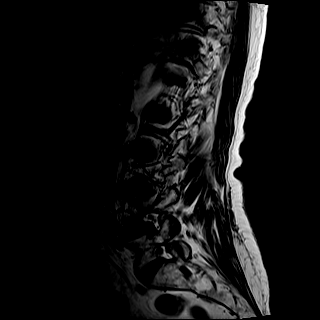
[im 7/16]
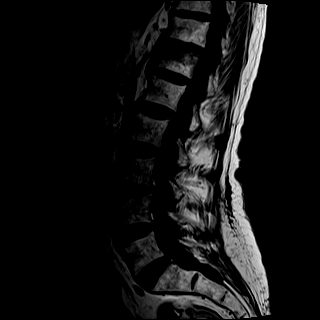
[im 10/16]
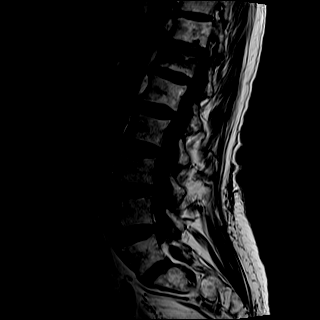
[im 13/16]
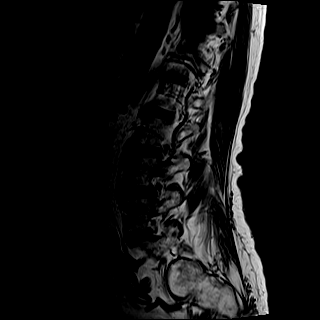
[im 16/16]
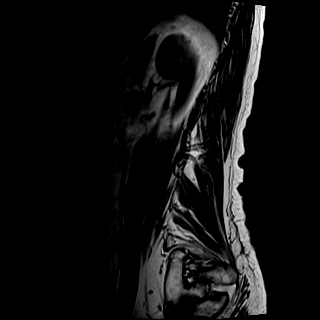

[Series 27: T2 · axial · 4.5mm · 0.57mm/px · z∈[-452,-235]mm · 8 of 37 slices shown (2 of 2)]
[im 1/37]
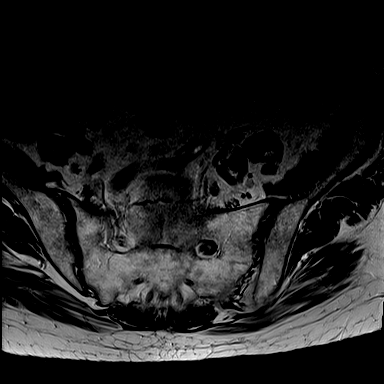
[im 6/37]
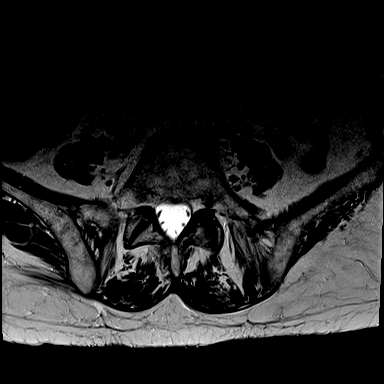
[im 12/37]
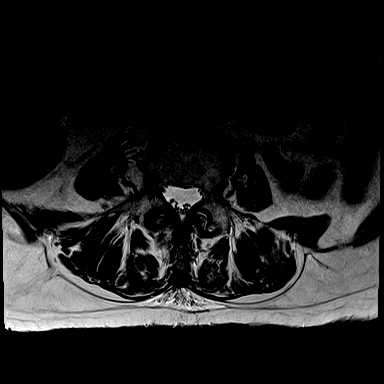
[im 17/37]
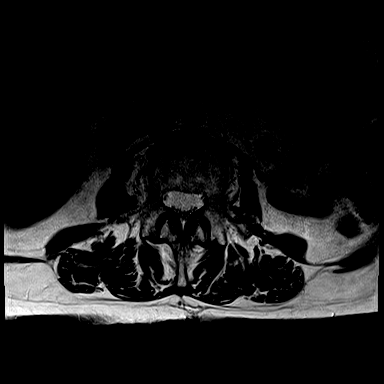
[im 20/37]
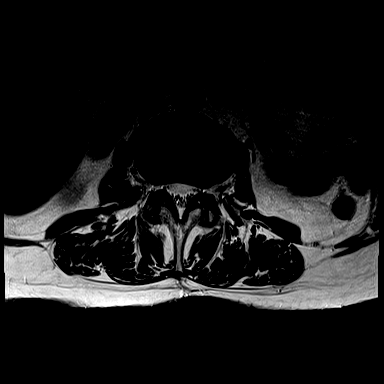
[im 25/37]
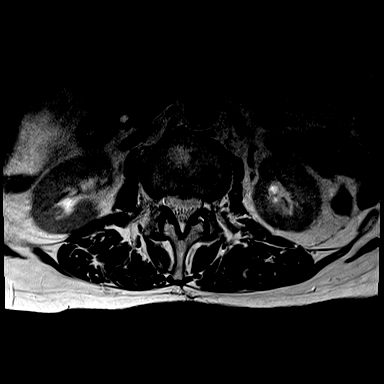
[im 31/37]
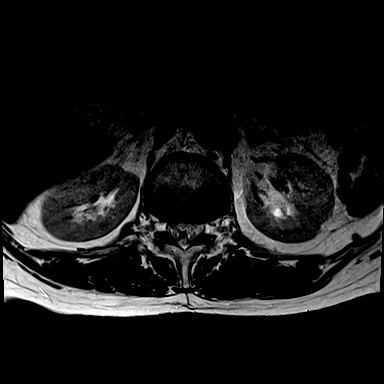
[im 37/37]
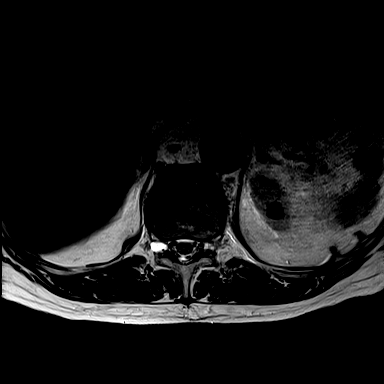

[Series 28: T1 · axial · 4.0mm · 0.34mm/px · z∈[-455,-270]mm · 4 of 40 slices shown (2 of 2)]
[im 1/40]
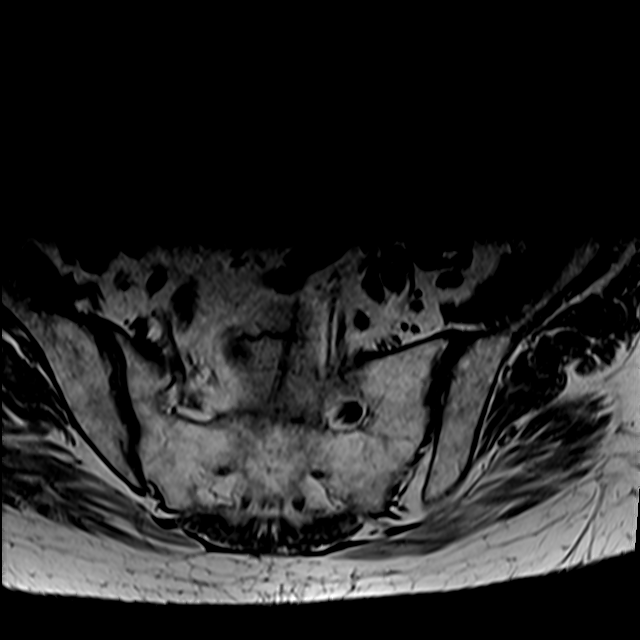
[im 6/40]
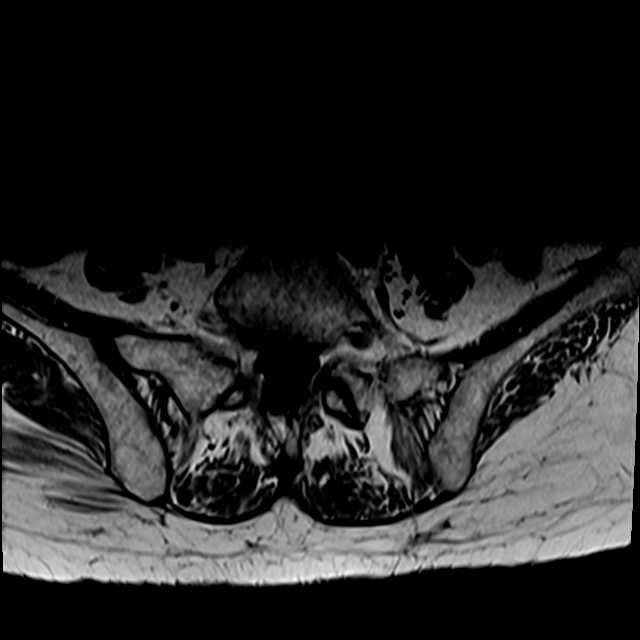
[im 20/40]
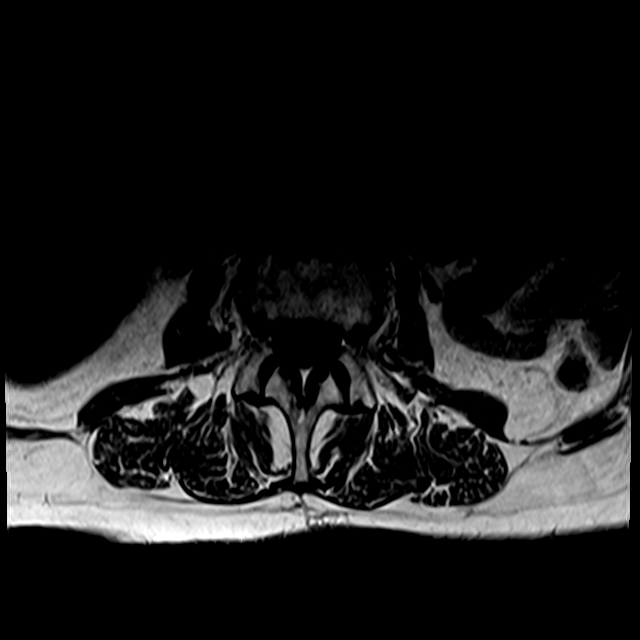
[im 34/40]
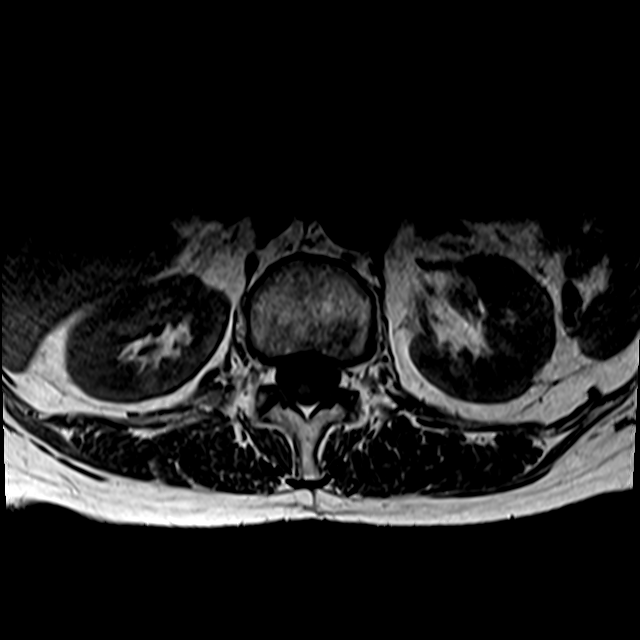

[25 of 48 positions shown; findings below may reference images not displayed]

FINDINGS: MRI THORACIC SPINE FINDINGS

Alignment: Examination somewhat degraded by motion artifact.

Physiologic with preservation of the normal thoracic kyphosis. No
listhesis.

Vertebrae: Acute compression fracture involving the T12 vertebral
body with mild 20% height loss and no more than trace 2 mm bony
retropulsion. No significant stenosis. Marrow edema extends into the
left T11-12 facet. No listhesis or malalignment.

Otherwise, vertebral body height maintained with no other acute or
chronic fracture. Bone marrow signal intensity heterogeneous but
overall within normal limits. Multiple scattered benign hemangiomata
noted. No worrisome osseous lesions.

Cord: Normal signal and morphology. No convincing cord signal
changes on this motion degraded exam. No epidural hematoma or other
collection.

Paraspinal and other soft tissues: Mild paraspinous edema adjacent
to the T12 compression fracture. Trace layering bilateral pleural
effusions. 1 cm T2 hypointense lesion present at the upper pole of
the left kidney (series 18, image 34), indeterminate, and
incompletely assessed on this exam.

Disc levels:

No significant spondylosis for patient age. No significant disc
bulge or focal disc herniation. No significant spinal stenosis.
Foramina remain patent.

MRI LUMBAR SPINE FINDINGS

Segmentation: Standard. Lowest well-formed disc space labeled the
L5-S1 level.

Alignment: Trace retrolisthesis of T12 on L1 and L1 on L2. Alignment
otherwise normal with preservation of the normal lumbar lordosis.

Vertebrae: No other acute or chronic fracture within the lumbar
spine. Vertebral body height maintained. Bone marrow signal
intensity heterogeneous but overall within normal limits. Few
scattered benign hemangiomata noted. No worrisome osseous lesions.
No other abnormal marrow edema.

Conus medullaris and cauda equina: Conus extends to the T12 level.
Conus and cauda equina appear normal. No epidural hematoma.

Paraspinal and other soft tissues: Mild edema within the left lower
posterior paraspinous soft tissues, likely reflecting a degree of
mild muscular injury/strain (series 25, image 13). No collections.
Paraspinous soft tissues demonstrate no other acute finding.
Additional subcentimeter simple left renal cyst noted, benign in
appearance, no follow-up imaging recommended regarding this lesion.
Layering T2 hypointensity within the gallbladder, which could
reflect stones and/or sludge. Atherosclerotic irregularity noted
within the visualized aorta without aneurysm. 1.2 cm benign
appearing cyst noted within the visualized liver.

Disc levels:

L1-2: Trace retrolisthesis with mild disc bulge and disc
desiccation. Superimposed tiny central disc protrusion. Mild facet
hypertrophy. No spinal stenosis. Foramina remain patent.

L2-3: Mild disc bulge. Mild bilateral facet hypertrophy. No
significant spinal stenosis. Foramina remain patent.

L3-4: Mild disc bulge with mild to moderate facet hypertrophy.
Resultant mild narrowing of the lateral recesses, right greater than
left. Central canal remains patent. Mild right L3 foraminal
stenosis. Left neural foramina remains patent.

L4-5: Mild disc bulge with right-sided reactive endplate spurring.
Mild to moderate facet hypertrophy. Resultant mild bilateral
subarticular stenosis. Central canal remains patent. Foramina remain
patent.

L5-S1: Negative interspace. Mild to moderate facet hypertrophy. No
spinal stenosis. Foramina remain patent.
IMPRESSION: 1. Acute compression fracture involving the T12 vertebral body with
mild 20% height loss and trace 2 mm bony retropulsion. No
significant stenosis.
2. Mild edema within the left lower posterior paraspinous soft
tissues, likely reflecting a degree of mild muscular injury/strain.
3. No other acute traumatic injury within the thoracolumbar spine.
4. Mild multilevel degenerative spondylosis for patient age. Mild
narrowing of the lateral recesses at L3-4 and L4-5 without frank
impingement.
5. 1 cm T2 hypointense lesion at the upper pole of the left kidney,
indeterminate, and incompletely assessed on this exam. Further
evaluation with non emergent renal mass protocol CT and/or MRI
suggested for further evaluation.
6. Layering stones and/or sludge within the gallbladder lumen.
7. Trace layering bilateral pleural effusions.

## 2021-06-14 IMAGING — MR MR THORACIC SPINE W/O CM
5 of 6 series · 23 of 48 positions shown · non-contrast
Comparison: Prior CT from [DATE].

CLINICAL DATA: Initial evaluation for acute back pain, known
compression fracture.

EXAM:
MRI THORACIC AND LUMBAR SPINE WITHOUT CONTRAST
TECHNIQUE: Multiplanar and multiecho pulse sequences of the thoracic and lumbar
spine were obtained without intravenous contrast.

[Series 14: T1 · sagittal · 6.0mm · 1.23mm/px · 2 of 9 slices shown (1 of 2)]
[im 1/9]
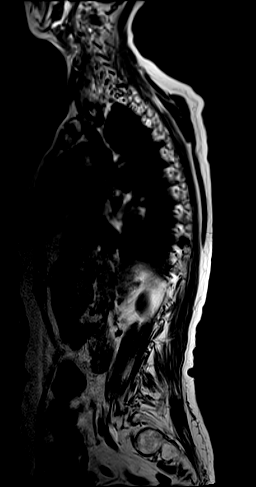
[im 9/9]
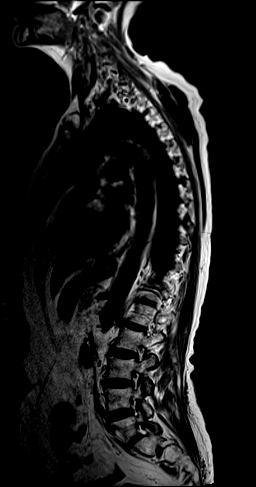

[Series 15: T2 · sagittal · 3.0mm · 0.76mm/px · 6 of 17 slices shown (1 of 2)]
[im 1/17]
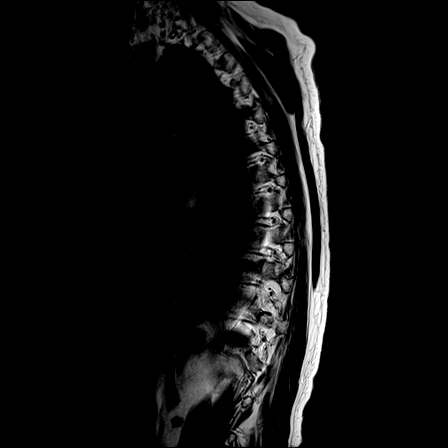
[im 4/17]
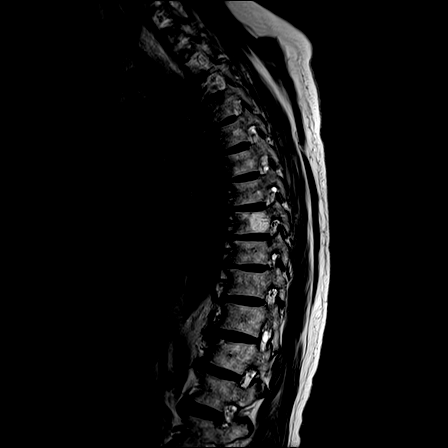
[im 7/17]
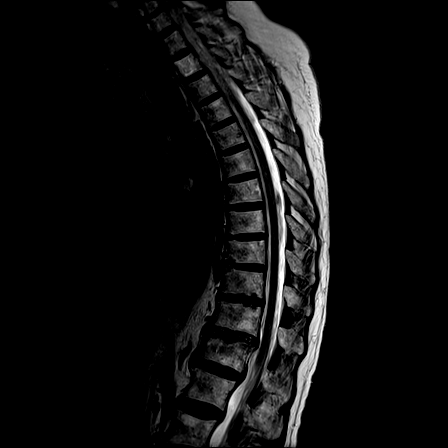
[im 10/17]
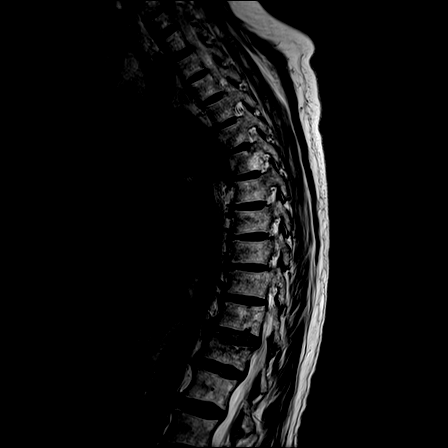
[im 13/17]
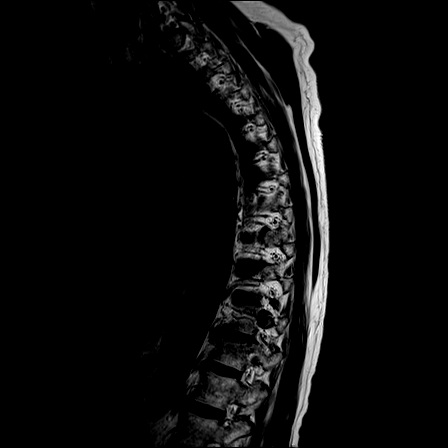
[im 17/17]
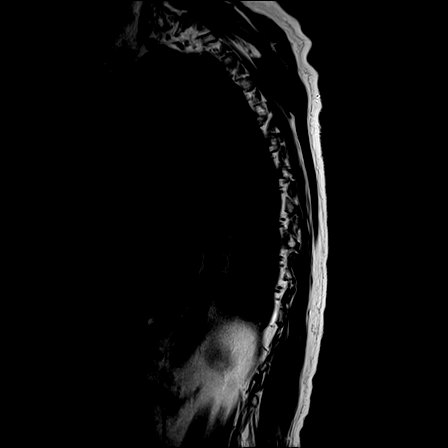

[Series 16: T1 · sagittal · 3.0mm · 0.76mm/px · 6 of 17 slices shown (2 of 2)]
[im 1/17]
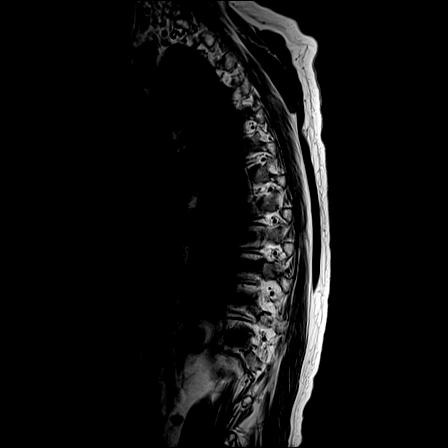
[im 4/17]
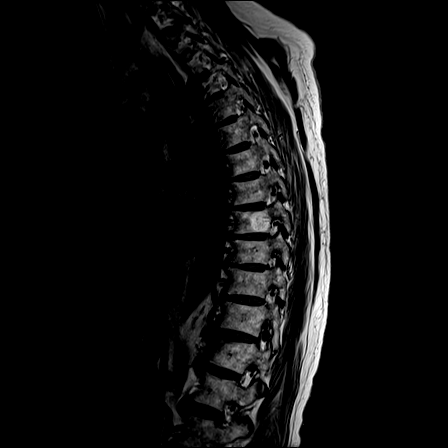
[im 7/17]
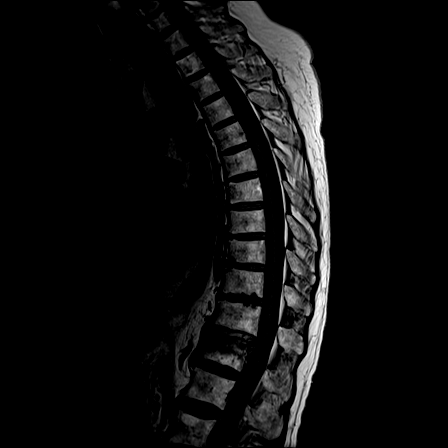
[im 10/17]
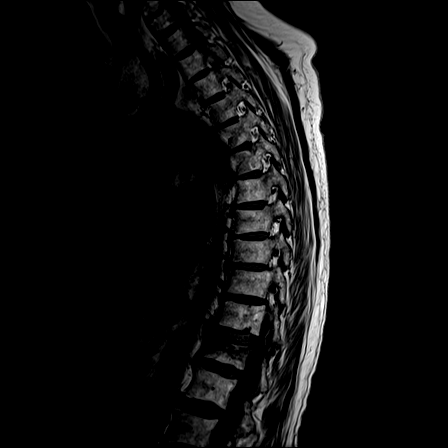
[im 13/17]
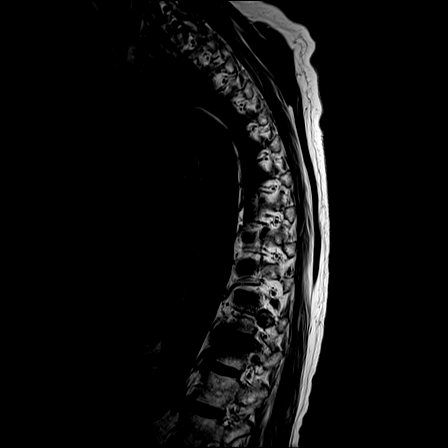
[im 17/17]
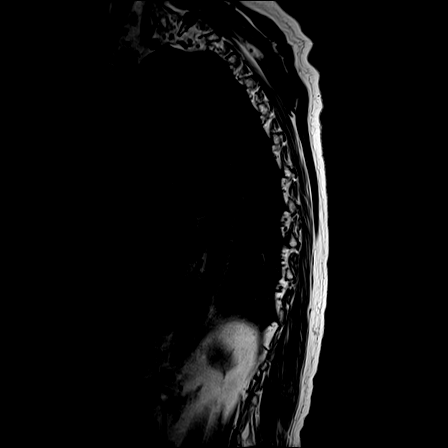

[Series 17: STIR · sagittal · 3.0mm · 0.38mm/px · 1 of 17 slices shown]
[im 1/17]
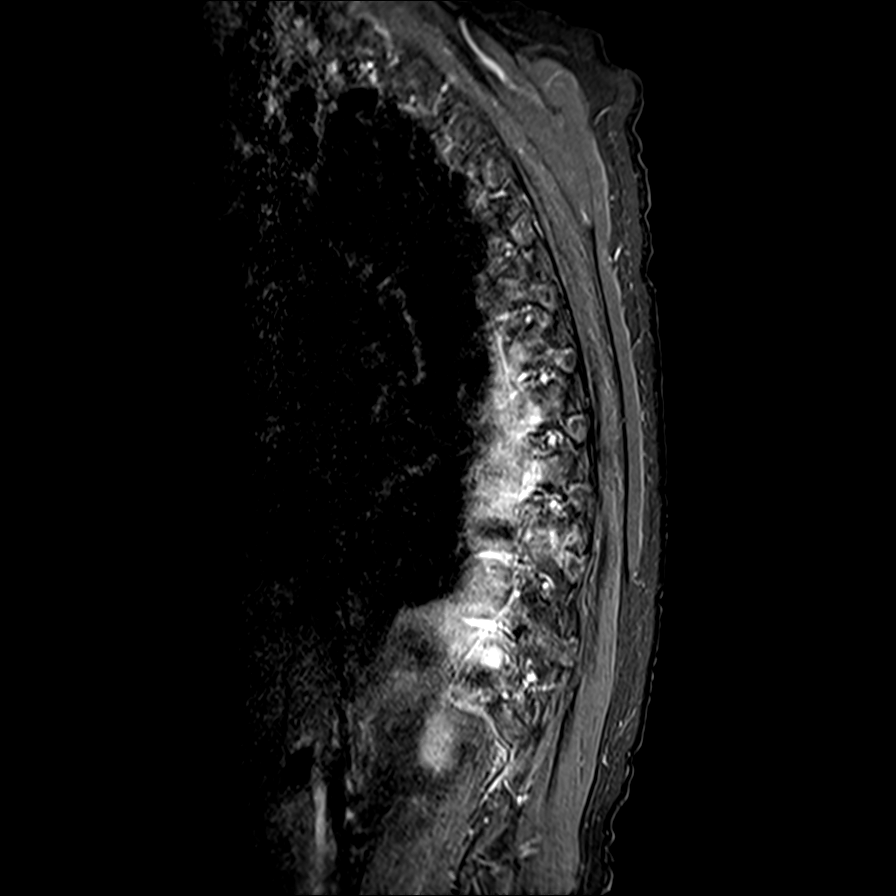

[Series 18: T2 · axial · 4.0mm · 0.59mm/px · z∈[-240,-32]mm · 8 of 39 slices shown (2 of 2)]
[im 1/39]
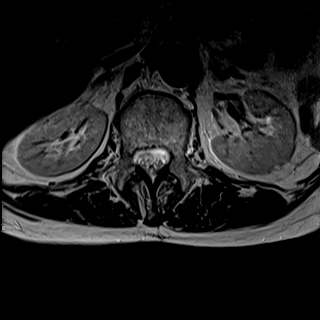
[im 6/39]
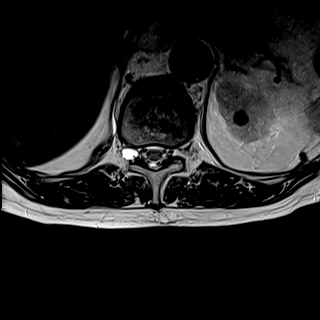
[im 12/39]
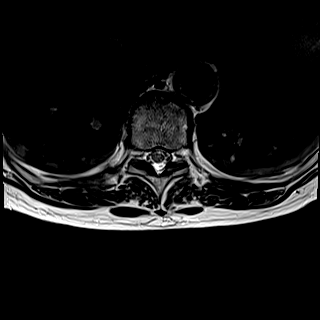
[im 18/39]
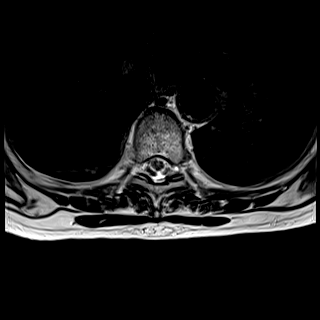
[im 21/39]
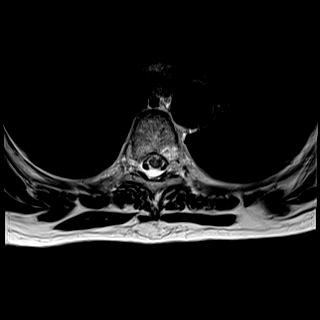
[im 27/39]
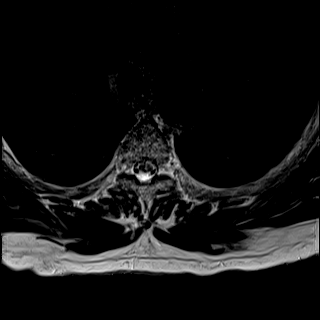
[im 33/39]
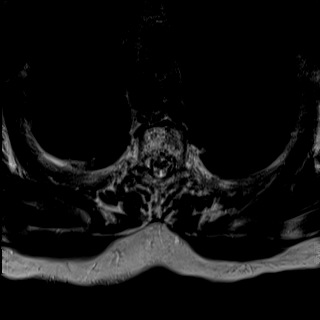
[im 39/39]
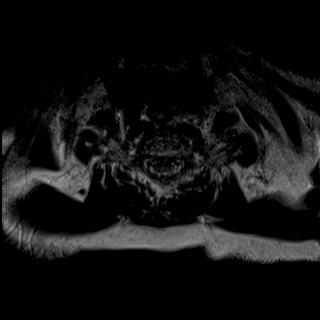

[23 of 48 positions shown; findings below may reference images not displayed]

FINDINGS: MRI THORACIC SPINE FINDINGS

Alignment: Examination somewhat degraded by motion artifact.

Physiologic with preservation of the normal thoracic kyphosis. No
listhesis.

Vertebrae: Acute compression fracture involving the T12 vertebral
body with mild 20% height loss and no more than trace 2 mm bony
retropulsion. No significant stenosis. Marrow edema extends into the
left T11-12 facet. No listhesis or malalignment.

Otherwise, vertebral body height maintained with no other acute or
chronic fracture. Bone marrow signal intensity heterogeneous but
overall within normal limits. Multiple scattered benign hemangiomata
noted. No worrisome osseous lesions.

Cord: Normal signal and morphology. No convincing cord signal
changes on this motion degraded exam. No epidural hematoma or other
collection.

Paraspinal and other soft tissues: Mild paraspinous edema adjacent
to the T12 compression fracture. Trace layering bilateral pleural
effusions. 1 cm T2 hypointense lesion present at the upper pole of
the left kidney (series 18, image 34), indeterminate, and
incompletely assessed on this exam.

Disc levels:

No significant spondylosis for patient age. No significant disc
bulge or focal disc herniation. No significant spinal stenosis.
Foramina remain patent.

MRI LUMBAR SPINE FINDINGS

Segmentation: Standard. Lowest well-formed disc space labeled the
L5-S1 level.

Alignment: Trace retrolisthesis of T12 on L1 and L1 on L2. Alignment
otherwise normal with preservation of the normal lumbar lordosis.

Vertebrae: No other acute or chronic fracture within the lumbar
spine. Vertebral body height maintained. Bone marrow signal
intensity heterogeneous but overall within normal limits. Few
scattered benign hemangiomata noted. No worrisome osseous lesions.
No other abnormal marrow edema.

Conus medullaris and cauda equina: Conus extends to the T12 level.
Conus and cauda equina appear normal. No epidural hematoma.

Paraspinal and other soft tissues: Mild edema within the left lower
posterior paraspinous soft tissues, likely reflecting a degree of
mild muscular injury/strain (series 25, image 13). No collections.
Paraspinous soft tissues demonstrate no other acute finding.
Additional subcentimeter simple left renal cyst noted, benign in
appearance, no follow-up imaging recommended regarding this lesion.
Layering T2 hypointensity within the gallbladder, which could
reflect stones and/or sludge. Atherosclerotic irregularity noted
within the visualized aorta without aneurysm. 1.2 cm benign
appearing cyst noted within the visualized liver.

Disc levels:

L1-2: Trace retrolisthesis with mild disc bulge and disc
desiccation. Superimposed tiny central disc protrusion. Mild facet
hypertrophy. No spinal stenosis. Foramina remain patent.

L2-3: Mild disc bulge. Mild bilateral facet hypertrophy. No
significant spinal stenosis. Foramina remain patent.

L3-4: Mild disc bulge with mild to moderate facet hypertrophy.
Resultant mild narrowing of the lateral recesses, right greater than
left. Central canal remains patent. Mild right L3 foraminal
stenosis. Left neural foramina remains patent.

L4-5: Mild disc bulge with right-sided reactive endplate spurring.
Mild to moderate facet hypertrophy. Resultant mild bilateral
subarticular stenosis. Central canal remains patent. Foramina remain
patent.

L5-S1: Negative interspace. Mild to moderate facet hypertrophy. No
spinal stenosis. Foramina remain patent.
IMPRESSION: 1. Acute compression fracture involving the T12 vertebral body with
mild 20% height loss and trace 2 mm bony retropulsion. No
significant stenosis.
2. Mild edema within the left lower posterior paraspinous soft
tissues, likely reflecting a degree of mild muscular injury/strain.
3. No other acute traumatic injury within the thoracolumbar spine.
4. Mild multilevel degenerative spondylosis for patient age. Mild
narrowing of the lateral recesses at L3-4 and L4-5 without frank
impingement.
5. 1 cm T2 hypointense lesion at the upper pole of the left kidney,
indeterminate, and incompletely assessed on this exam. Further
evaluation with non emergent renal mass protocol CT and/or MRI
suggested for further evaluation.
6. Layering stones and/or sludge within the gallbladder lumen.
7. Trace layering bilateral pleural effusions.

## 2021-06-14 MED ORDER — DEXAMETHASONE SODIUM PHOSPHATE 10 MG/ML IJ SOLN
10.0000 mg | Freq: Once | INTRAMUSCULAR | Status: AC
  Administered 2021-06-14: 10 mg via INTRAVENOUS
  Filled 2021-06-14: qty 1

## 2021-06-14 MED ORDER — HYDROCODONE-ACETAMINOPHEN 5-325 MG PO TABS: 1.0000 | ORAL_TABLET | Freq: Four times a day (QID) | ORAL | 0 refills | Status: DC | PRN

## 2021-06-14 MED ORDER — METHOCARBAMOL 500 MG PO TABS: 500.0000 mg | ORAL_TABLET | Freq: Two times a day (BID) | ORAL | 0 refills | Status: DC

## 2021-06-14 MED ORDER — FENTANYL CITRATE PF 50 MCG/ML IJ SOSY
50.0000 ug | PREFILLED_SYRINGE | Freq: Once | INTRAMUSCULAR | Status: AC
  Administered 2021-06-14: 50 ug via INTRAVENOUS
  Filled 2021-06-14: qty 1

## 2021-06-14 MED ORDER — POLYETHYLENE GLYCOL 3350 17 GM/SCOOP PO POWD
17.0000 g | Freq: Two times a day (BID) | ORAL | 0 refills | Status: DC
Start: 2021-06-14 — End: 2023-09-03

## 2021-06-14 NOTE — ED Provider Notes (Addendum)
? ?Assumed care at shift change.  See prior note for full H&P.  Briefly, 81 y.o. F here with low back pain x1 months and constipation for the past several days.  Sent for MRI T/L spine, r/o cauda equina. ? ?Results for orders placed or performed during the hospital encounter of 06/13/21  ?CBC  ?Result Value Ref Range  ? WBC 10.6 (H) 4.0 - 10.5 K/uL  ? RBC 4.43 3.87 - 5.11 MIL/uL  ? Hemoglobin 13.6 12.0 - 15.0 g/dL  ? HCT 41.1 36.0 - 46.0 %  ? MCV 92.8 80.0 - 100.0 fL  ? MCH 30.7 26.0 - 34.0 pg  ? MCHC 33.1 30.0 - 36.0 g/dL  ? RDW 15.8 (H) 11.5 - 15.5 %  ? Platelets 399 150 - 400 K/uL  ? nRBC 0.0 0.0 - 0.2 %  ?Comprehensive metabolic panel  ?Result Value Ref Range  ? Sodium 137 135 - 145 mmol/L  ? Potassium 4.3 3.5 - 5.1 mmol/L  ? Chloride 102 98 - 111 mmol/L  ? CO2 29 22 - 32 mmol/L  ? Glucose, Bld 102 (H) 70 - 99 mg/dL  ? BUN 19 8 - 23 mg/dL  ? Creatinine, Ser 0.88 0.44 - 1.00 mg/dL  ? Calcium 9.7 8.9 - 10.3 mg/dL  ? Total Protein 7.7 6.5 - 8.1 g/dL  ? Albumin 4.1 3.5 - 5.0 g/dL  ? AST 17 15 - 41 U/L  ? ALT 12 0 - 44 U/L  ? Alkaline Phosphatase 96 38 - 126 U/L  ? Total Bilirubin 0.4 0.3 - 1.2 mg/dL  ? GFR, Estimated >60 >60 mL/min  ? Anion gap 6 5 - 15  ?Lipase, blood  ?Result Value Ref Range  ? Lipase 27 11 - 51 U/L  ?Urinalysis, Routine w reflex microscopic Urine, Clean Catch  ?Result Value Ref Range  ? Color, Urine YELLOW YELLOW  ? APPearance CLEAR CLEAR  ? Specific Gravity, Urine 1.005 1.005 - 1.030  ? pH 6.0 5.0 - 8.0  ? Glucose, UA NEGATIVE NEGATIVE mg/dL  ? Hgb urine dipstick NEGATIVE NEGATIVE  ? Bilirubin Urine NEGATIVE NEGATIVE  ? Ketones, ur NEGATIVE NEGATIVE mg/dL  ? Protein, ur NEGATIVE NEGATIVE mg/dL  ? Nitrite NEGATIVE NEGATIVE  ? Leukocytes,Ua NEGATIVE NEGATIVE  ? ?DG Abdomen 1 View ? ?Result Date: 06/13/2021 ?CLINICAL DATA:  Constipation EXAM: ABDOMEN - 1 VIEW COMPARISON:  None Available. FINDINGS: Nonobstructive bowel-gas pattern. Large amount of retained fecal material throughout the colon.  Suspicious calcifications identified. Advanced degenerative changes of the hips noted. IMPRESSION: Large amount of retained fecal material throughout the colon. Electronically Signed   By: Jannifer Hick M.D.   On: 06/13/2021 14:19  ? ?CT Lumbar Spine Wo Contrast ? ?Result Date: 06/13/2021 ?CLINICAL DATA:  Lumbar radiculopathy. EXAM: CT LUMBAR SPINE WITHOUT CONTRAST TECHNIQUE: Multidetector CT imaging of the lumbar spine was performed without intravenous contrast administration. Multiplanar CT image reconstructions were also generated. RADIATION DOSE REDUCTION: This exam was performed according to the departmental dose-optimization program which includes automated exposure control, adjustment of the mA and/or kV according to patient size and/or use of iterative reconstruction technique. COMPARISON:  None Available. FINDINGS: Segmentation: There are five lumbar type vertebral bodies. The last full intervertebral disc space is labeled L5-S1. Alignment: Normal Vertebrae: The lumbar vertebral bodies are intact. No acute fracture or bone lesion. Disc spaces are preserved. Multilevel intervertebral disc calcifications are noted. There is a compression deformity of the T12 vertebral body which appears to be an acute or subacute compression  fracture. Small amount of paraspinal hematoma noted. Paraspinal and other soft tissues: Small amount of paraspinal hematoma noted at T12. Advanced atherosclerotic calcifications involving the aorta and branch vessels but no aneurysm. Cholelithiasis is noted. Hyperdense/hemorrhagic cyst associated with the upper pole region of the left kidney. Disc levels: T11-12: Osteophytic ridging but no significant retropulsion or canal compromise. T12-L1: Intervertebral disc calcifications are noted. Is also ossification of the posterior longitudinal ligament. No significant spinal or foraminal stenosis. L1-2: Mild annular bulge and moderate facet disease but no significant spinal or foraminal  stenosis. L2-3: Mild annular bulge and moderate facet disease but no focal disc protrusions, spinal or foraminal stenosis. Moderate calcification of the lamina noted. L3-4: Diffuse bulging annulus and partial calcification of the disc. There is also ligamentum flavum thickening and partial calcification. The combination contributes to moderate spinal and bilateral lateral recess stenosis. No foraminal stenosis. L4-5: Bulging partially calcified disc, facet disease and ligamentum flavum thickening with calcifications contributing to moderate spinal and bilateral lateral recess stenosis. No foraminal stenosis. L5-S1: No significant findings. IMPRESSION: 1. Acute or subacute T12 compression fracture with small amount of paraspinal hematoma. No retropulsion or canal compromise. 2. No lumbar fractures are identified. 3. 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 4. Advanced vascular disease. Aortic Atherosclerosis (ICD10-I70.0). Electronically Signed   By: Rudie Meyer M.D.   On: 06/13/2021 16:17  ? ?MR THORACIC SPINE WO CONTRAST ? ?Result Date: 06/14/2021 ?CLINICAL DATA:  Initial evaluation for acute back pain, known compression fracture. EXAM: MRI THORACIC AND LUMBAR SPINE WITHOUT CONTRAST TECHNIQUE: Multiplanar and multiecho pulse sequences of the thoracic and lumbar spine were obtained without intravenous contrast. COMPARISON:  Prior CT from 06/13/2021. FINDINGS: MRI THORACIC SPINE FINDINGS Alignment: Examination somewhat degraded by motion artifact. Physiologic with preservation of the normal thoracic kyphosis. No listhesis. Vertebrae: Acute compression fracture involving the T12 vertebral body with mild 20% height loss and no more than trace 2 mm bony retropulsion. No significant stenosis. Marrow edema extends into the left T11-12 facet. No listhesis or malalignment. Otherwise, vertebral body height maintained with no other acute or chronic fracture. Bone marrow signal  intensity heterogeneous but overall within normal limits. Multiple scattered benign hemangiomata noted. No worrisome osseous lesions. Cord: Normal signal and morphology. No convincing cord signal changes on this motion degraded exam. No epidural hematoma or other collection. Paraspinal and other soft tissues: Mild paraspinous edema adjacent to the T12 compression fracture. Trace layering bilateral pleural effusions. 1 cm T2 hypointense lesion present at the upper pole of the left kidney (series 18, image 34), indeterminate, and incompletely assessed on this exam. Disc levels: No significant spondylosis for patient age. No significant disc bulge or focal disc herniation. No significant spinal stenosis. Foramina remain patent. MRI LUMBAR SPINE FINDINGS Segmentation: Standard. Lowest well-formed disc space labeled the L5-S1 level. Alignment: Trace retrolisthesis of T12 on L1 and L1 on L2. Alignment otherwise normal with preservation of the normal lumbar lordosis. Vertebrae: No other acute or chronic fracture within the lumbar spine. Vertebral body height maintained. Bone marrow signal intensity heterogeneous but overall within normal limits. Few scattered benign hemangiomata noted. No worrisome osseous lesions. No other abnormal marrow edema. Conus medullaris and cauda equina: Conus extends to the T12 level. Conus and cauda equina appear normal. No epidural hematoma. Paraspinal and other soft tissues: Mild edema within the left lower posterior paraspinous soft tissues, likely reflecting a degree of mild muscular injury/strain (series 25, image 13). No collections. Paraspinous soft tissues demonstrate  no other acute finding. Additional subcentimeter simple left renal cyst noted, benign in appearance, no follow-up imaging recommended regarding this lesion. Layering T2 hypointensity within the gallbladder, which could reflect stones and/or sludge. Atherosclerotic irregularity noted within the visualized aorta without  aneurysm. 1.2 cm benign appearing cyst noted within the visualized liver. Disc levels: L1-2: Trace retrolisthesis with mild disc bulge and disc desiccation. Superimposed tiny central disc protrusion. Mild facet hyper

## 2021-06-14 NOTE — Progress Notes (Signed)
Orthopedic Tech Progress Note ?Patient Details:  ?JAXSON KEENER ?17-Feb-1940 ?[id number] ? ?Ortho Devices ?Type of Ortho Device: Lumbar corsett ?Ortho Device/Splint Interventions: Ordered, Application, Adjustment ? Dr requested a normal lso ?Post Interventions ?Patient Tolerated: Well ?Instructions Provided: Care of device, Adjustment of device ? ?Trinna Post ?06/14/2021, 4:59 AM ? ?

## 2021-06-14 NOTE — Discharge Instructions (Addendum)
Take the prescribed medication as directed.  Use caution as this can cause drowsiness/sleepiness. ?Start using miralax twice a day until bowel movements improve, then can reduce to one daily.  Make sure to use this while taking pain medication as this can worsen constipation. ?There was noted to be a small area on left kidney that will need follow-up imaging-- your primary care doctor can arrange this for you. ?Follow-up with neurosurgery clinic in 2 weeks.  I would call Monday and get appt scheduled. ?Return to the ED for new or worsening symptoms. ?

## 2021-06-30 ENCOUNTER — Other Ambulatory Visit: Payer: Self-pay | Admitting: Student

## 2021-06-30 ENCOUNTER — Other Ambulatory Visit (HOSPITAL_COMMUNITY): Payer: Self-pay | Admitting: Student

## 2021-06-30 DIAGNOSIS — S22080A Wedge compression fracture of T11-T12 vertebra, initial encounter for closed fracture: Secondary | ICD-10-CM

## 2021-07-01 ENCOUNTER — Ambulatory Visit (HOSPITAL_COMMUNITY)
Admission: RE | Admit: 2021-07-01 | Discharge: 2021-07-01 | Disposition: A | Discharge status: Home / Self Care | Payer: Medicare Other | Source: Ambulatory Visit | Attending: Student | Admitting: Student

## 2021-07-01 DIAGNOSIS — S22080A Wedge compression fracture of T11-T12 vertebra, initial encounter for closed fracture: Secondary | ICD-10-CM | POA: Insufficient documentation

## 2021-07-01 IMAGING — CT CT T SPINE W/O CM
3 of 5 series · 10 of 33 positions shown, 11 images · non-contrast
Comparison: MRI [DATE]

CLINICAL DATA: T12 compression fracture

EXAM:
CT THORACIC SPINE WITHOUT CONTRAST
TECHNIQUE: Multidetector CT images of the thoracic were obtained using the
standard protocol without intravenous contrast.
RADIATION DOSE REDUCTION: This exam was performed according to the
departmental dose-optimization program which includes automated
exposure control, adjustment of the mA and/or kV according to
patient size and/or use of iterative reconstruction technique.

[Series 3: t spine bone · axial · 0.35mm/px · z∈[-348,-232]mm · 2 of 175 slices shown, 3 images]
[im 59/175  soft-tissue]
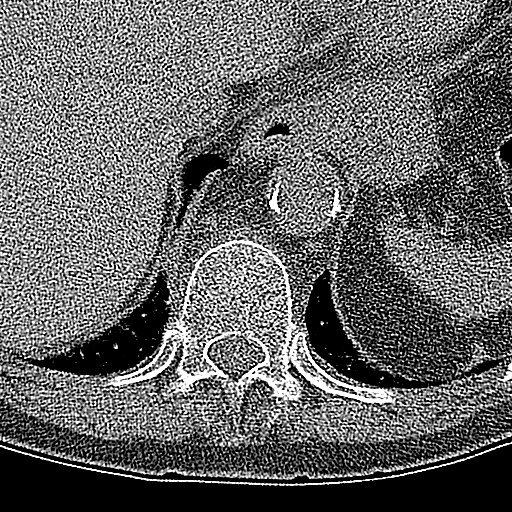
[im 59/175  bone]
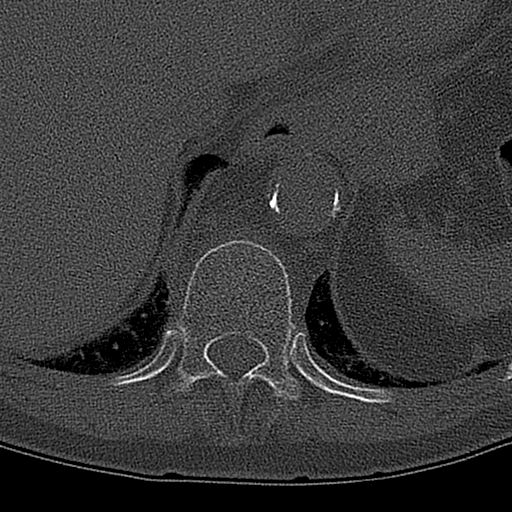
[im 117/175  bone]
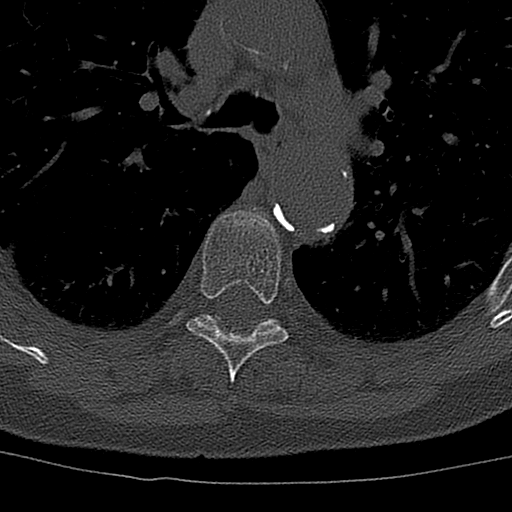

[Series 5: sag bone · sagittal · 0.36mm/px · 5 of 81 slices shown]
[im 27/81  bone]
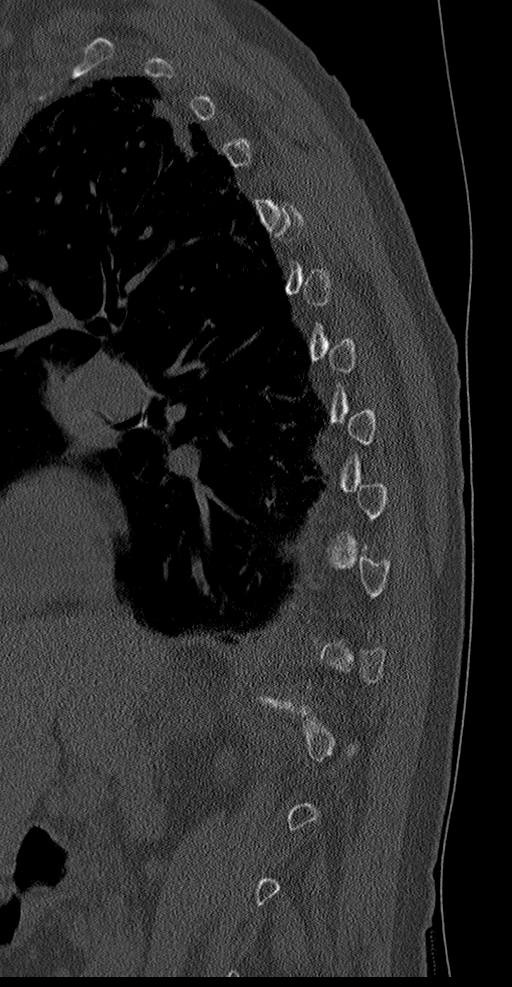
[im 34/81  bone]
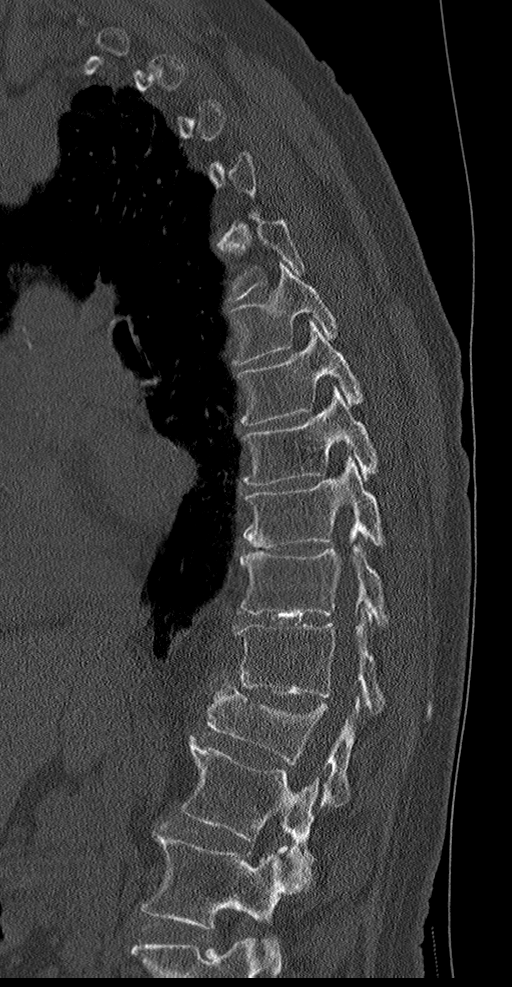
[im 41/81  bone]
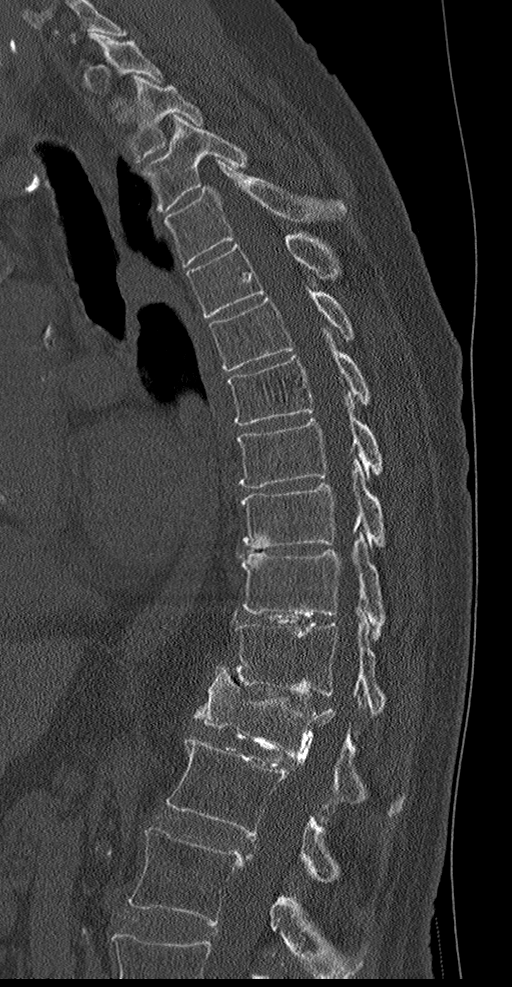
[im 47/81  bone]
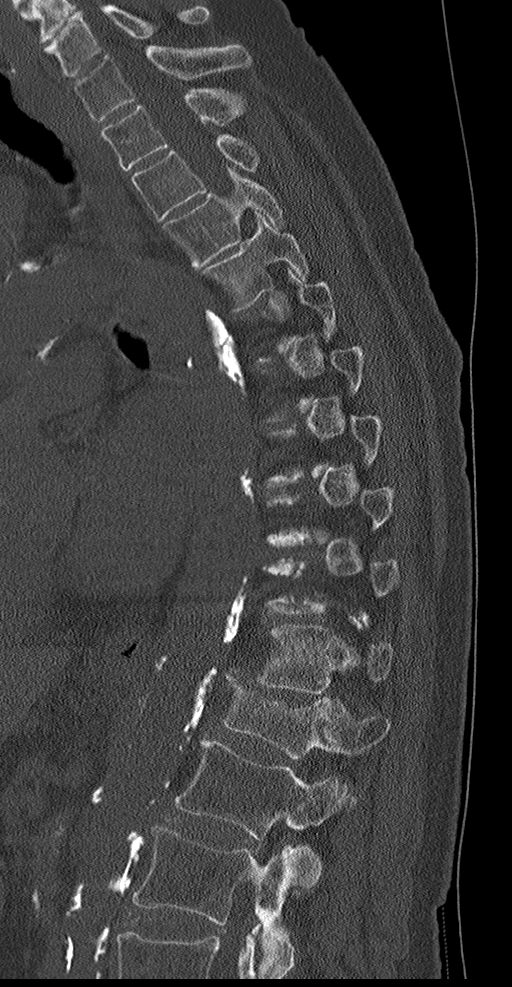
[im 54/81  bone]
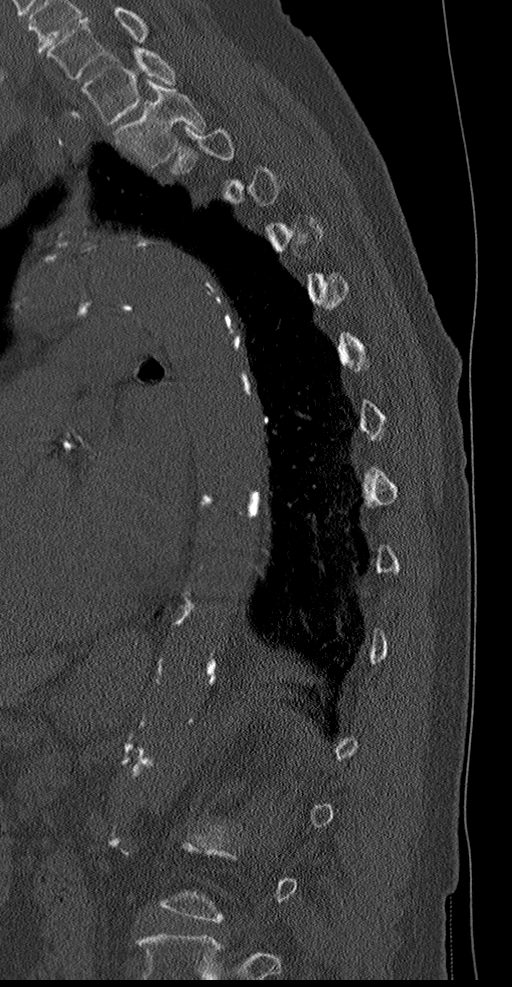

[Series 6: cor bone · coronal · 0.34mm/px · 3 of 88 slices shown]
[im 18/88  bone]
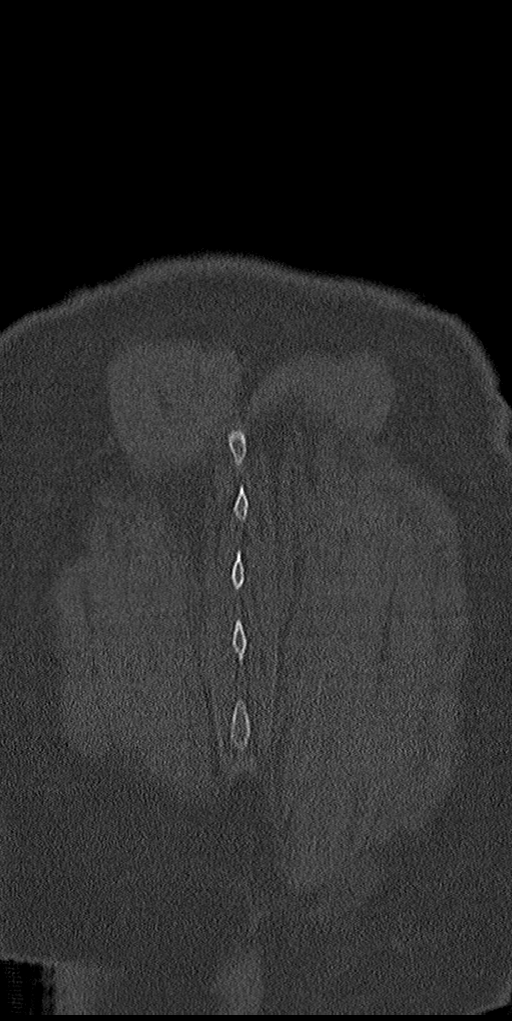
[im 35/88  bone]
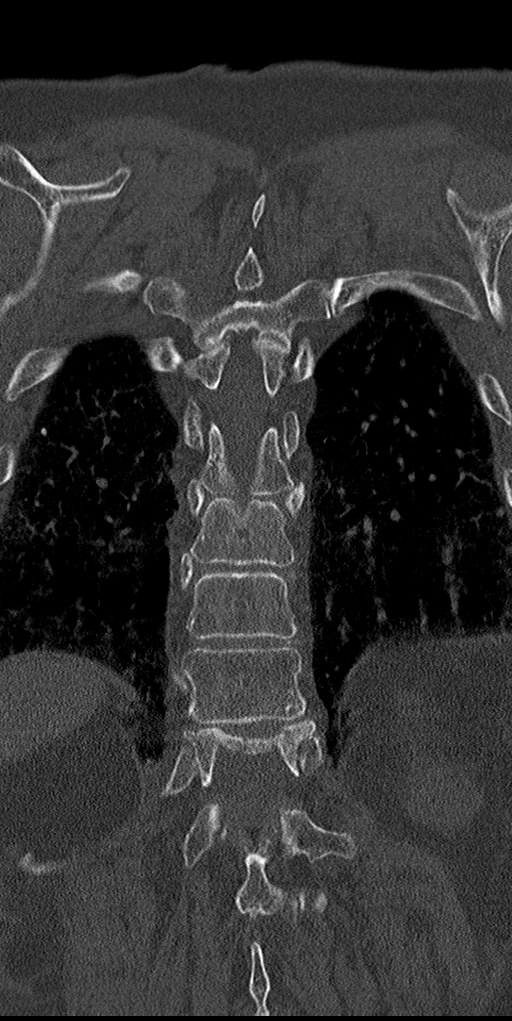
[im 53/88  bone]
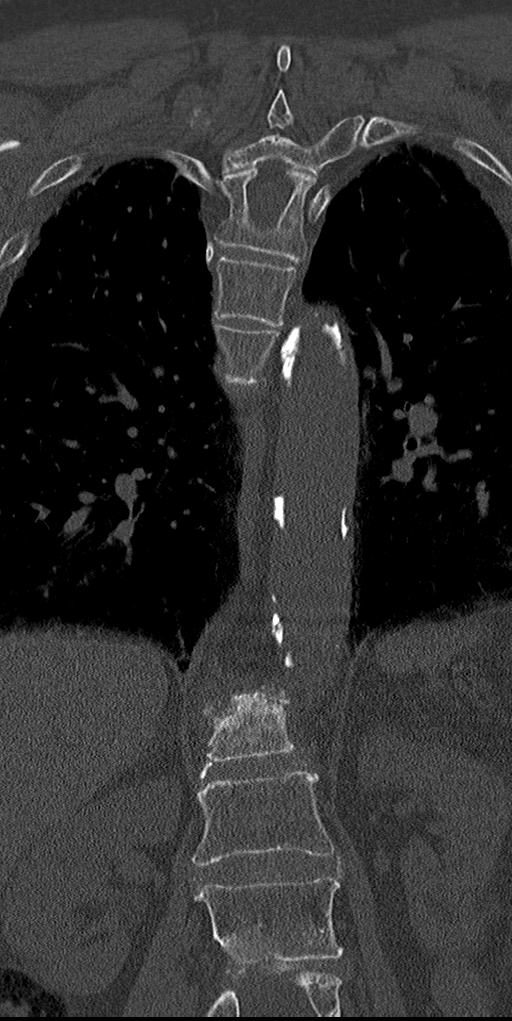

[10 of 33 positions shown; findings below may reference images not displayed]

FINDINGS: Alignment: Mild lower thoracic kyphosis. 4 mm degenerative
retrolisthesis at T12-L1.

Vertebrae: Further compression of the T12 vertebral body, currently
14 mm craniocaudad and previously 18 mm on the MRI from [DATE].
This represents a 40% compression fracture. Coronally oriented
component of the fracture anteriorly in the vertebral body with
involvement of the posterior vertebral body margin compatible with
middle column involvement. Questionable subtle extension into the
base of the left T12 pedicle.

There 5 mm of bony retropulsion the T12 level along the
posterosuperior endplate.

Multilevel degenerative disc disease most notably at T9-10, T10-11,
T11-12. Schmorl's nodes along the endplates at T10-11 with anterior
subcortical sclerosis T9-10.

Vertebral hemangioma eccentric to the right at the T8 vertebral body
level.

Healing left posteromedial ninth rib fracture, image 89 series 3.

Healing right posteromedial tenth rib fracture, image 100 series 3.

Paraspinal and other soft tissues: Coronary, aortic arch, and branch
vessel atherosclerotic vascular disease. Paraspinal edema at T12.
Cholelithiasis. Atherosclerosis includes calcific plaque in the
proximal SMA and proximal celiac trunk. 0.8 cm Bosniak category 2
cyst of the left kidney upper pole, 89 Hounsfield units, benign.
Biapical pleuroparenchymal scarring.

Disc levels: No significant findings above the T11-12 level.

T11-12: Mild to moderate central narrowing of the thecal sac due to
posterior bony retropulsion.
IMPRESSION: 1. Mild further collapse of the T12 vertebral body, currently 14 mm
in height and previously 18 mm on the MRI from [DATE]. This is
associated with 5 mm of posterior bony retropulsion which
contributes to mild to moderate central narrowing of the thecal sac
at the T11-12 level.
2. 4 mm degenerative retrolisthesis at T12-L1.
3. Healing left posteromedial ninth rib fracture and healing right
posteromedial tenth rib fracture.
4. Other imaging findings of potential clinical significance:
Extensive atherosclerosis. Aortic Atherosclerosis ([M8]-[M8]).
Cholelithiasis. Benign Bosniak category 2 cyst of the left kidney
upper pole.

## 2021-08-20 ENCOUNTER — Other Ambulatory Visit: Payer: Self-pay | Admitting: Family Medicine

## 2021-08-20 ENCOUNTER — Other Ambulatory Visit (HOSPITAL_COMMUNITY): Payer: Self-pay | Admitting: Family Medicine

## 2021-08-20 DIAGNOSIS — N289 Disorder of kidney and ureter, unspecified: Secondary | ICD-10-CM

## 2021-09-23 ENCOUNTER — Ambulatory Visit (HOSPITAL_COMMUNITY)
Admission: RE | Admit: 2021-09-23 | Discharge: 2021-09-23 | Disposition: A | Discharge status: Home / Self Care | Payer: Medicare Other | Source: Ambulatory Visit | Attending: Family Medicine | Admitting: Family Medicine

## 2021-09-23 DIAGNOSIS — N289 Disorder of kidney and ureter, unspecified: Secondary | ICD-10-CM | POA: Insufficient documentation

## 2021-09-23 LAB — POCT I-STAT CREATININE: Creatinine, Ser: 0.9 mg/dL (ref 0.44–1.00)

## 2021-09-23 MED ORDER — IOHEXOL 300 MG/ML  SOLN
100.0000 mL | Freq: Once | INTRAMUSCULAR | Status: AC | PRN
  Administered 2021-09-23: 80 mL via INTRAVENOUS

## 2021-12-31 ENCOUNTER — Other Ambulatory Visit (HOSPITAL_COMMUNITY): Payer: Self-pay | Admitting: Family Medicine

## 2021-12-31 DIAGNOSIS — Z780 Asymptomatic menopausal state: Secondary | ICD-10-CM
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
## 2023-09-02 ENCOUNTER — Emergency Department (HOSPITAL_COMMUNITY)

## 2023-09-02 ENCOUNTER — Encounter (HOSPITAL_COMMUNITY): Payer: Self-pay | Admitting: Emergency Medicine

## 2023-09-02 ENCOUNTER — Other Ambulatory Visit: Payer: Self-pay

## 2023-09-02 ENCOUNTER — Inpatient Hospital Stay (HOSPITAL_COMMUNITY)
Admission: EM | Admit: 2023-09-02 | Discharge: 2023-09-08 | DRG: 481 | Disposition: A | Discharge status: Skilled Nursing Facility | Attending: Internal Medicine | Admitting: Internal Medicine

## 2023-09-02 DIAGNOSIS — E43 Unspecified severe protein-calorie malnutrition: Secondary | ICD-10-CM | POA: Diagnosis present

## 2023-09-02 DIAGNOSIS — Y92009 Unspecified place in unspecified non-institutional (private) residence as the place of occurrence of the external cause: Secondary | ICD-10-CM | POA: Diagnosis not present

## 2023-09-02 DIAGNOSIS — W19XXXA Unspecified fall, initial encounter: Secondary | ICD-10-CM | POA: Diagnosis not present

## 2023-09-02 DIAGNOSIS — S14109D Unspecified injury at unspecified level of cervical spinal cord, subsequent encounter: Secondary | ICD-10-CM | POA: Diagnosis not present

## 2023-09-02 DIAGNOSIS — S72002A Fracture of unspecified part of neck of left femur, initial encounter for closed fracture: Secondary | ICD-10-CM | POA: Diagnosis not present

## 2023-09-02 DIAGNOSIS — Z791 Long term (current) use of non-steroidal anti-inflammatories (NSAID): Secondary | ICD-10-CM

## 2023-09-02 DIAGNOSIS — Z79899 Other long term (current) drug therapy: Secondary | ICD-10-CM | POA: Diagnosis not present

## 2023-09-02 DIAGNOSIS — Z885 Allergy status to narcotic agent status: Secondary | ICD-10-CM

## 2023-09-02 DIAGNOSIS — Z8261 Family history of arthritis: Secondary | ICD-10-CM

## 2023-09-02 DIAGNOSIS — S72142A Displaced intertrochanteric fracture of left femur, initial encounter for closed fracture: Principal | ICD-10-CM | POA: Insufficient documentation

## 2023-09-02 DIAGNOSIS — M810 Age-related osteoporosis without current pathological fracture: Secondary | ICD-10-CM

## 2023-09-02 DIAGNOSIS — R636 Underweight: Secondary | ICD-10-CM | POA: Diagnosis present

## 2023-09-02 DIAGNOSIS — M80052A Age-related osteoporosis with current pathological fracture, left femur, initial encounter for fracture: Principal | ICD-10-CM | POA: Diagnosis present

## 2023-09-02 DIAGNOSIS — Z681 Body mass index (BMI) 19 or less, adult: Secondary | ICD-10-CM | POA: Diagnosis not present

## 2023-09-02 DIAGNOSIS — I2510 Atherosclerotic heart disease of native coronary artery without angina pectoris: Secondary | ICD-10-CM | POA: Diagnosis present

## 2023-09-02 DIAGNOSIS — S7292XA Unspecified fracture of left femur, initial encounter for closed fracture: Principal | ICD-10-CM | POA: Diagnosis present

## 2023-09-02 DIAGNOSIS — E785 Hyperlipidemia, unspecified: Secondary | ICD-10-CM | POA: Diagnosis present

## 2023-09-02 DIAGNOSIS — Y939 Activity, unspecified: Secondary | ICD-10-CM

## 2023-09-02 DIAGNOSIS — F17210 Nicotine dependence, cigarettes, uncomplicated: Secondary | ICD-10-CM | POA: Diagnosis present

## 2023-09-02 DIAGNOSIS — Z7983 Long term (current) use of bisphosphonates: Secondary | ICD-10-CM

## 2023-09-02 DIAGNOSIS — S14109A Unspecified injury at unspecified level of cervical spinal cord, initial encounter: Secondary | ICD-10-CM | POA: Diagnosis not present

## 2023-09-02 DIAGNOSIS — W1830XA Fall on same level, unspecified, initial encounter: Secondary | ICD-10-CM | POA: Diagnosis present

## 2023-09-02 DIAGNOSIS — R54 Age-related physical debility: Secondary | ICD-10-CM | POA: Diagnosis present

## 2023-09-02 DIAGNOSIS — S79912A Unspecified injury of left hip, initial encounter: Secondary | ICD-10-CM | POA: Diagnosis present

## 2023-09-02 DIAGNOSIS — E039 Hypothyroidism, unspecified: Secondary | ICD-10-CM | POA: Diagnosis present

## 2023-09-02 DIAGNOSIS — E038 Other specified hypothyroidism: Secondary | ICD-10-CM | POA: Diagnosis not present

## 2023-09-02 DIAGNOSIS — Z79890 Hormone replacement therapy (postmenopausal): Secondary | ICD-10-CM

## 2023-09-02 HISTORY — DX: Disorder of thyroid, unspecified: E07.9

## 2023-09-02 HISTORY — DX: Atherosclerotic heart disease of native coronary artery without angina pectoris: I25.10

## 2023-09-02 HISTORY — DX: Other complications of anesthesia, initial encounter: T88.59XA

## 2023-09-02 HISTORY — DX: Hypothyroidism, unspecified: E03.9

## 2023-09-02 LAB — BASIC METABOLIC PANEL WITH GFR
Anion gap: 12 (ref 5–15)
BUN: 16 mg/dL (ref 8–23)
CO2: 26 mmol/L (ref 22–32)
Calcium: 9.5 mg/dL (ref 8.9–10.3)
Chloride: 98 mmol/L (ref 98–111)
Creatinine, Ser: 0.84 mg/dL (ref 0.44–1.00)
GFR, Estimated: >60 mL/min (ref >60)
Glucose, Bld: 116 mg/dL — ABNORMAL HIGH (ref 70–99)
Potassium: 4.4 mmol/L (ref 3.5–5.1)
Sodium: 136 mmol/L (ref 135–145)

## 2023-09-02 LAB — CBC WITH DIFFERENTIAL/PLATELET
Abs Immature Granulocytes: 0.05 K/uL (ref 0.00–0.07)
Basophils Absolute: 0.1 K/uL (ref 0.0–0.1)
Basophils Relative: 0 %
Eosinophils Absolute: 0.1 K/uL (ref 0.0–0.5)
Eosinophils Relative: 1 %
HCT: 36.2 % (ref 36.0–46.0)
Hemoglobin: 12.1 g/dL (ref 12.0–15.0)
Immature Granulocytes: 0 %
Lymphocytes Relative: 11 %
Lymphs Abs: 1.6 K/uL (ref 0.7–4.0)
MCH: 30.4 pg (ref 26.0–34.0)
MCHC: 33.4 g/dL (ref 30.0–36.0)
MCV: 91 fL (ref 80.0–100.0)
Monocytes Absolute: 0.9 K/uL (ref 0.1–1.0)
Monocytes Relative: 6 %
Neutro Abs: 11.9 K/uL — ABNORMAL HIGH (ref 1.7–7.7)
Neutrophils Relative %: 82 %
Platelets: 369 K/uL (ref 150–400)
RBC: 3.98 MIL/uL (ref 3.87–5.11)
RDW: 14.6 % (ref 11.5–15.5)
WBC: 14.6 K/uL — ABNORMAL HIGH (ref 4.0–10.5)
nRBC: 0 % (ref 0.0–0.2)

## 2023-09-02 LAB — PROTIME-INR
INR: 1 (ref 0.8–1.2)
Prothrombin Time: 13.4 s (ref 11.4–15.2)

## 2023-09-02 MED ORDER — HYDROMORPHONE HCL 1 MG/ML IJ SOLN
0.5000 mg | Freq: Once | INTRAMUSCULAR | Status: AC
  Administered 2023-09-02: 0.5 mg via INTRAVENOUS

## 2023-09-02 MED ORDER — HYDROMORPHONE HCL 1 MG/ML IJ SOLN
0.5000 mg | INTRAMUSCULAR | Status: DC | PRN
  Administered 2023-09-02: 0.5 mg via INTRAVENOUS
  Filled 2023-09-02 (×2): qty 0.5
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
## 2023-09-02 NOTE — Assessment & Plan Note (Addendum)
 Continue supportive medical therapy Pain control with IV morphine, po hydrocodone  and acetaminophen  Dvt prophylaxis  GI prophylaxis.   NPO past midnight, follow up with orthopedics recommendations
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
## 2023-09-02 NOTE — ED Notes (Signed)
 Ortho  ( [place])paged to Dr Towana

## 2023-09-02 NOTE — ED Notes (Addendum)
 L foot : Pedal pulse present via doppler; foot warm to touch; sensation intact; patient able to move toes

## 2023-09-02 NOTE — Assessment & Plan Note (Signed)
 Continue with statin therapy.  ?

## 2023-09-02 NOTE — Assessment & Plan Note (Signed)
 Continue with levothyroxine

## 2023-09-02 NOTE — ED Notes (Signed)
 Portable xray at bedside.

## 2023-09-02 NOTE — Assessment & Plan Note (Signed)
 Follow up as outpatient  On alendronate once weekly
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
## 2023-09-02 NOTE — ED Notes (Signed)
 Dr Jerri [facility]) unassigned paged to Dr Towana @ [phone]

## 2023-09-02 NOTE — Assessment & Plan Note (Signed)
 Subtle ligamentous injury involving the craniocervical junction, C2-C3 & C3-C4 interspinous ligaments  Continue with rigid cervical collar per neurosurgery recommendations

## 2023-09-03 ENCOUNTER — Inpatient Hospital Stay (HOSPITAL_COMMUNITY): Admitting: Certified Registered Nurse Anesthetist

## 2023-09-03 ENCOUNTER — Inpatient Hospital Stay (HOSPITAL_COMMUNITY)

## 2023-09-03 ENCOUNTER — Encounter (HOSPITAL_COMMUNITY)
Admission: EM | Disposition: A | Discharge status: Skilled Nursing Facility | Payer: Self-pay | Source: Home / Self Care | Attending: Internal Medicine

## 2023-09-03 ENCOUNTER — Encounter (HOSPITAL_COMMUNITY): Payer: Self-pay | Admitting: Internal Medicine

## 2023-09-03 ENCOUNTER — Other Ambulatory Visit: Payer: Self-pay

## 2023-09-03 DIAGNOSIS — S72002A Fracture of unspecified part of neck of left femur, initial encounter for closed fracture: Secondary | ICD-10-CM | POA: Diagnosis not present

## 2023-09-03 DIAGNOSIS — S72142A Displaced intertrochanteric fracture of left femur, initial encounter for closed fracture: Secondary | ICD-10-CM

## 2023-09-03 DIAGNOSIS — E039 Hypothyroidism, unspecified: Secondary | ICD-10-CM

## 2023-09-03 DIAGNOSIS — E785 Hyperlipidemia, unspecified: Secondary | ICD-10-CM | POA: Diagnosis not present

## 2023-09-03 DIAGNOSIS — S14109A Unspecified injury at unspecified level of cervical spinal cord, initial encounter: Secondary | ICD-10-CM | POA: Diagnosis not present

## 2023-09-03 HISTORY — PX: INTRAMEDULLARY (IM) NAIL INTERTROCHANTERIC: SHX5875

## 2023-09-03 LAB — CBC
HCT: 36.3 % (ref 36.0–46.0)
HCT: 32.7 % — ABNORMAL LOW (ref 36.0–46.0)
Hemoglobin: 12.1 g/dL (ref 12.0–15.0)
Hemoglobin: 10.6 g/dL — ABNORMAL LOW (ref 12.0–15.0)
MCH: 29.6 pg (ref 26.0–34.0)
MCH: 29.4 pg (ref 26.0–34.0)
MCHC: 33.3 g/dL (ref 30.0–36.0)
MCHC: 32.4 g/dL (ref 30.0–36.0)
MCV: 88.8 fL (ref 80.0–100.0)
MCV: 90.8 fL (ref 80.0–100.0)
Platelets: 279 K/uL (ref 150–400)
Platelets: 305 K/uL (ref 150–400)
RBC: 4.09 MIL/uL (ref 3.87–5.11)
RBC: 3.6 MIL/uL — ABNORMAL LOW (ref 3.87–5.11)
RDW: 14.6 % (ref 11.5–15.5)
RDW: 14.7 % (ref 11.5–15.5)
WBC: 12.1 K/uL — ABNORMAL HIGH (ref 4.0–10.5)
WBC: 14.4 K/uL — ABNORMAL HIGH (ref 4.0–10.5)
nRBC: 0 % (ref 0.0–0.2)
nRBC: 0 % (ref 0.0–0.2)

## 2023-09-03 LAB — BASIC METABOLIC PANEL WITH GFR
Anion gap: 9 (ref 5–15)
BUN: 13 mg/dL (ref 8–23)
CO2: 27 mmol/L (ref 22–32)
Calcium: 8.9 mg/dL (ref 8.9–10.3)
Chloride: 101 mmol/L (ref 98–111)
Creatinine, Ser: 0.85 mg/dL (ref 0.44–1.00)
GFR, Estimated: >60 mL/min (ref >60)
Glucose, Bld: 140 mg/dL — ABNORMAL HIGH (ref 70–99)
Potassium: 3.9 mmol/L (ref 3.5–5.1)
Sodium: 137 mmol/L (ref 135–145)

## 2023-09-03 SURGERY — FIXATION, FRACTURE, INTERTROCHANTERIC, WITH INTRAMEDULLARY ROD
Anesthesia: General | Laterality: Left

## 2023-09-03 MED ORDER — GABAPENTIN 100 MG PO CAPS: 100.0000 mg | ORAL_CAPSULE | Freq: Three times a day (TID) | ORAL | Status: DC

## 2023-09-03 MED ORDER — ONDANSETRON HCL 4 MG PO TABS
4.0000 mg | ORAL_TABLET | Freq: Four times a day (QID) | ORAL | Status: DC | PRN
Start: 2023-09-03 — End: 2023-09-03

## 2023-09-03 MED ORDER — GABAPENTIN 300 MG PO CAPS
300.0000 mg | ORAL_CAPSULE | Freq: Three times a day (TID) | ORAL | Status: DC
  Filled 2023-09-03: qty 1

## 2023-09-03 MED ORDER — HYDROCODONE-ACETAMINOPHEN 7.5-325 MG PO TABS
1.0000 | ORAL_TABLET | Freq: Every day | ORAL | Status: DC | PRN
  Administered 2023-09-03: 1 via ORAL
  Administered 2023-09-04 – 2023-09-07 (×4): 2 via ORAL
  Filled 2023-09-03 (×7): qty 2

## 2023-09-03 MED ORDER — HYDROCODONE-ACETAMINOPHEN 5-325 MG PO TABS
1.0000 | ORAL_TABLET | Freq: Four times a day (QID) | ORAL | Status: DC | PRN
  Administered 2023-09-03: 1 via ORAL
  Filled 2023-09-03 (×2): qty 1

## 2023-09-03 MED ORDER — PHENOL 1.4 % MT LIQD: 1.0000 | OROMUCOSAL | Status: DC | PRN

## 2023-09-03 MED ORDER — CEFAZOLIN SODIUM-DEXTROSE 2-4 GM/100ML-% IV SOLN
2.0000 g | Freq: Once | INTRAVENOUS | Status: AC
  Administered 2023-09-03: 2 g via INTRAVENOUS

## 2023-09-03 MED ORDER — SODIUM CHLORIDE 0.9 % IV SOLN: INTRAVENOUS | Status: DC

## 2023-09-03 MED ORDER — PROPOFOL 10 MG/ML IV BOLUS
INTRAVENOUS | Status: AC
  Filled 2023-09-03: qty 20

## 2023-09-03 MED ORDER — PHENYLEPHRINE 80 MCG/ML (10ML) SYRINGE FOR IV PUSH (FOR BLOOD PRESSURE SUPPORT)
PREFILLED_SYRINGE | INTRAVENOUS | Status: DC | PRN
  Administered 2023-09-03 (×2): 80 ug via INTRAVENOUS

## 2023-09-03 MED ORDER — HYDROMORPHONE HCL 1 MG/ML IJ SOLN
1.0000 mg | Freq: Once | INTRAMUSCULAR | Status: AC
  Administered 2023-09-03: 1 mg via INTRAVENOUS
  Filled 2023-09-03: qty 1

## 2023-09-03 MED ORDER — METHOCARBAMOL 500 MG PO TABS
500.0000 mg | ORAL_TABLET | Freq: Three times a day (TID) | ORAL | Status: DC | PRN
Start: 2023-09-03 — End: 2023-09-08
  Administered 2023-09-04 – 2023-09-07 (×6): 500 mg via ORAL
  Filled 2023-09-03 (×6): qty 1

## 2023-09-03 MED ORDER — ONDANSETRON HCL 4 MG PO TABS: 4.0000 mg | ORAL_TABLET | Freq: Four times a day (QID) | ORAL | Status: DC | PRN

## 2023-09-03 MED ORDER — ACETAMINOPHEN 650 MG RE SUPP
650.0000 mg | Freq: Four times a day (QID) | RECTAL | Status: DC | PRN
Start: 2023-09-03 — End: 2023-09-03

## 2023-09-03 MED ORDER — HYDROCODONE-ACETAMINOPHEN 5-325 MG PO TABS
1.0000 | ORAL_TABLET | Freq: Two times a day (BID) | ORAL | Status: DC | PRN
  Administered 2023-09-04: 1 via ORAL
  Administered 2023-09-04 – 2023-09-08 (×5): 2 via ORAL
  Filled 2023-09-03 (×6): qty 2

## 2023-09-03 MED ORDER — FENTANYL CITRATE (PF) 250 MCG/5ML IJ SOLN
INTRAMUSCULAR | Status: AC
  Filled 2023-09-03: qty 5

## 2023-09-03 MED ORDER — FENTANYL CITRATE (PF) 100 MCG/2ML IJ SOLN
INTRAMUSCULAR | Status: AC
Start: 2023-09-03 — End: 2023-09-03
  Filled 2023-09-03: qty 2

## 2023-09-03 MED ORDER — ONDANSETRON HCL 4 MG/2ML IJ SOLN: 4.0000 mg | Freq: Four times a day (QID) | INTRAMUSCULAR | Status: DC | PRN

## 2023-09-03 MED ORDER — ENOXAPARIN SODIUM 40 MG/0.4ML IJ SOSY
40.0000 mg | PREFILLED_SYRINGE | INTRAMUSCULAR | Status: DC
  Administered 2023-09-04 – 2023-09-08 (×5): 40 mg via SUBCUTANEOUS
  Filled 2023-09-03 (×5): qty 0.4

## 2023-09-03 MED ORDER — LEVOTHYROXINE SODIUM 75 MCG PO TABS
75.0000 ug | ORAL_TABLET | Freq: Every day | ORAL | Status: DC
  Administered 2023-09-04 – 2023-09-08 (×5): 75 ug via ORAL
  Filled 2023-09-03 (×7): qty 1

## 2023-09-03 MED ORDER — HYDROMORPHONE HCL 1 MG/ML IJ SOLN: 0.5000 mg | INTRAMUSCULAR | Status: DC | PRN

## 2023-09-03 MED ORDER — ACETAMINOPHEN 325 MG PO TABS: 650.0000 mg | ORAL_TABLET | Freq: Four times a day (QID) | ORAL | Status: DC | PRN

## 2023-09-03 MED ORDER — PANTOPRAZOLE SODIUM 40 MG PO TBEC
40.0000 mg | DELAYED_RELEASE_TABLET | Freq: Every day | ORAL | Status: DC
  Administered 2023-09-04 – 2023-09-08 (×5): 40 mg via ORAL
  Filled 2023-09-03 (×5): qty 1

## 2023-09-03 MED ORDER — DEXAMETHASONE SODIUM PHOSPHATE 10 MG/ML IJ SOLN
INTRAMUSCULAR | Status: DC | PRN
  Administered 2023-09-03: 5 mg via INTRAVENOUS

## 2023-09-03 MED ORDER — CEFAZOLIN SODIUM-DEXTROSE 2-4 GM/100ML-% IV SOLN
INTRAVENOUS | Status: AC
  Filled 2023-09-03: qty 100

## 2023-09-03 MED ORDER — MORPHINE SULFATE (PF) 2 MG/ML IV SOLN
0.5000 mg | INTRAVENOUS | Status: DC | PRN
  Administered 2023-09-03: 0.5 mg via INTRAVENOUS
  Administered 2023-09-04 – 2023-09-07 (×6): 1 mg via INTRAVENOUS
  Filled 2023-09-03 (×7): qty 1

## 2023-09-03 MED ORDER — TRANEXAMIC ACID-NACL 1000-0.7 MG/100ML-% IV SOLN
INTRAVENOUS | Status: AC
  Filled 2023-09-03: qty 100

## 2023-09-03 MED ORDER — TRANEXAMIC ACID-NACL 1000-0.7 MG/100ML-% IV SOLN
1000.0000 mg | INTRAVENOUS | Status: AC
  Administered 2023-09-03: 1000 mg via INTRAVENOUS

## 2023-09-03 MED ORDER — MENTHOL 3 MG MT LOZG: 1.0000 | LOZENGE | OROMUCOSAL | Status: DC | PRN

## 2023-09-03 MED ORDER — METHOCARBAMOL 1000 MG/10ML IJ SOLN
500.0000 mg | Freq: Three times a day (TID) | INTRAMUSCULAR | Status: DC
  Administered 2023-09-03: 500 mg via INTRAVENOUS
  Filled 2023-09-03: qty 10

## 2023-09-03 MED ORDER — HYDRALAZINE HCL 20 MG/ML IJ SOLN
5.0000 mg | INTRAMUSCULAR | Status: DC | PRN
  Administered 2023-09-03: 5 mg via INTRAVENOUS
  Filled 2023-09-03: qty 1

## 2023-09-03 MED ORDER — METHOCARBAMOL 1000 MG/10ML IJ SOLN
500.0000 mg | Freq: Three times a day (TID) | INTRAMUSCULAR | Status: DC | PRN
  Administered 2023-09-05: 500 mg via INTRAVENOUS
  Filled 2023-09-03: qty 10

## 2023-09-03 MED ORDER — SORBITOL 70 % SOLN: 30.0000 mL | Freq: Every day | Status: DC | PRN

## 2023-09-03 MED ORDER — ENOXAPARIN SODIUM 40 MG/0.4ML IJ SOSY: 40.0000 mg | PREFILLED_SYRINGE | Freq: Every day | INTRAMUSCULAR | 0 refills | Status: AC

## 2023-09-03 MED ORDER — ONDANSETRON HCL 4 MG/2ML IJ SOLN
INTRAMUSCULAR | Status: DC | PRN
  Administered 2023-09-03: 4 mg via INTRAVENOUS

## 2023-09-03 MED ORDER — ACETAMINOPHEN 325 MG PO TABS: 325.0000 mg | ORAL_TABLET | Freq: Four times a day (QID) | ORAL | Status: DC | PRN

## 2023-09-03 MED ORDER — ACETAMINOPHEN 500 MG PO TABS
500.0000 mg | ORAL_TABLET | Freq: Four times a day (QID) | ORAL | Status: AC
  Administered 2023-09-03 – 2023-09-04 (×3): 500 mg via ORAL
  Filled 2023-09-03 (×4): qty 1

## 2023-09-03 MED ORDER — LACTATED RINGERS IV SOLN: INTRAVENOUS | Status: AC

## 2023-09-03 MED ORDER — SUGAMMADEX SODIUM 200 MG/2ML IV SOLN
INTRAVENOUS | Status: DC | PRN
  Administered 2023-09-03: 200 mg via INTRAVENOUS

## 2023-09-03 MED ORDER — PROPOFOL 10 MG/ML IV BOLUS
INTRAVENOUS | Status: DC | PRN
  Administered 2023-09-03: 100 mg via INTRAVENOUS

## 2023-09-03 MED ORDER — AMISULPRIDE (ANTIEMETIC) 5 MG/2ML IV SOLN
5.0000 mg | Freq: Once | INTRAVENOUS | Status: AC
  Administered 2023-09-03: 5 mg via INTRAVENOUS

## 2023-09-03 MED ORDER — ORAL CARE MOUTH RINSE: 15.0000 mL | Freq: Once | OROMUCOSAL | Status: AC

## 2023-09-03 MED ORDER — HYDROCODONE-ACETAMINOPHEN 5-325 MG PO TABS: 1.0000 | ORAL_TABLET | Freq: Every day | ORAL | 0 refills | Status: DC | PRN

## 2023-09-03 MED ORDER — DOCUSATE SODIUM 100 MG PO CAPS
100.0000 mg | ORAL_CAPSULE | Freq: Two times a day (BID) | ORAL | Status: DC
  Administered 2023-09-03 – 2023-09-08 (×10): 100 mg via ORAL
  Filled 2023-09-03 (×10): qty 1

## 2023-09-03 MED ORDER — LACTATED RINGERS IV SOLN: INTRAVENOUS | Status: DC

## 2023-09-03 MED ORDER — POLYETHYLENE GLYCOL 3350 17 G PO PACK: 17.0000 g | PACK | Freq: Two times a day (BID) | ORAL | Status: DC

## 2023-09-03 MED ORDER — 0.9 % SODIUM CHLORIDE (POUR BTL) OPTIME
TOPICAL | Status: DC | PRN
Start: 2023-09-03 — End: 2023-09-03
  Administered 2023-09-03: 1000 mL

## 2023-09-03 MED ORDER — MAGNESIUM CITRATE PO SOLN: 1.0000 | Freq: Once | ORAL | Status: DC | PRN

## 2023-09-03 MED ORDER — CHLORHEXIDINE GLUCONATE 0.12 % MT SOLN
OROMUCOSAL | Status: AC
Start: 2023-09-03 — End: 2023-09-03
  Filled 2023-09-03: qty 15

## 2023-09-03 MED ORDER — LIDOCAINE 2% (20 MG/ML) 5 ML SYRINGE
INTRAMUSCULAR | Status: DC | PRN
  Administered 2023-09-03: 20 mg via INTRAVENOUS

## 2023-09-03 MED ORDER — FENTANYL CITRATE (PF) 250 MCG/5ML IJ SOLN
INTRAMUSCULAR | Status: DC | PRN
  Administered 2023-09-03: 25 ug via INTRAVENOUS
  Administered 2023-09-03: 50 ug via INTRAVENOUS
  Administered 2023-09-03: 25 ug via INTRAVENOUS
  Administered 2023-09-03: 50 ug via INTRAVENOUS

## 2023-09-03 MED ORDER — MORPHINE SULFATE (PF) 2 MG/ML IV SOLN
2.0000 mg | INTRAVENOUS | Status: DC | PRN
  Administered 2023-09-03 (×2): 2 mg via INTRAVENOUS
  Filled 2023-09-03 (×2): qty 1

## 2023-09-03 MED ORDER — TRANEXAMIC ACID-NACL 1000-0.7 MG/100ML-% IV SOLN
1000.0000 mg | Freq: Once | INTRAVENOUS | Status: AC
  Administered 2023-09-03: 1000 mg via INTRAVENOUS
  Filled 2023-09-03: qty 100

## 2023-09-03 MED ORDER — AMISULPRIDE (ANTIEMETIC) 5 MG/2ML IV SOLN
INTRAVENOUS | Status: AC
  Filled 2023-09-03: qty 2

## 2023-09-03 MED ORDER — PRAVASTATIN SODIUM 40 MG PO TABS
80.0000 mg | ORAL_TABLET | Freq: Every day | ORAL | Status: DC
  Administered 2023-09-04 – 2023-09-08 (×5): 80 mg via ORAL
  Filled 2023-09-03 (×5): qty 2

## 2023-09-03 MED ORDER — CEFAZOLIN SODIUM-DEXTROSE 2-4 GM/100ML-% IV SOLN
2.0000 g | Freq: Four times a day (QID) | INTRAVENOUS | Status: AC
  Administered 2023-09-03 – 2023-09-04 (×3): 2 g via INTRAVENOUS
  Filled 2023-09-03 (×3): qty 100

## 2023-09-03 MED ORDER — POLYETHYLENE GLYCOL 3350 17 G PO PACK
17.0000 g | PACK | Freq: Every day | ORAL | Status: DC | PRN
  Administered 2023-09-05: 17 g via ORAL
  Filled 2023-09-03: qty 1

## 2023-09-03 MED ORDER — ROCURONIUM BROMIDE 10 MG/ML (PF) SYRINGE
PREFILLED_SYRINGE | INTRAVENOUS | Status: DC | PRN
  Administered 2023-09-03: 50 mg via INTRAVENOUS

## 2023-09-03 MED ORDER — FENTANYL CITRATE (PF) 100 MCG/2ML IJ SOLN
25.0000 ug | INTRAMUSCULAR | Status: DC | PRN
  Administered 2023-09-03: 50 ug via INTRAVENOUS
  Administered 2023-09-03: 25 ug via INTRAVENOUS

## 2023-09-03 MED ORDER — CHLORHEXIDINE GLUCONATE 0.12 % MT SOLN
15.0000 mL | Freq: Once | OROMUCOSAL | Status: AC
  Administered 2023-09-03: 15 mL via OROMUCOSAL

## 2023-09-03 MED ORDER — ALUM & MAG HYDROXIDE-SIMETH 200-200-20 MG/5ML PO SUSP: 30.0000 mL | ORAL | Status: DC | PRN

## 2023-09-03 MED ORDER — ENOXAPARIN SODIUM 40 MG/0.4ML IJ SOSY: 40.0000 mg | PREFILLED_SYRINGE | INTRAMUSCULAR | Status: DC

## 2023-09-03 SURGICAL SUPPLY — 43 items
BAG COUNTER SPONGE SURGICOUNT (BAG) ×2 IMPLANT
BIT DRILL INTERTAN LAG SCREW (BIT) IMPLANT
BIT DRILL LONG 4.0 (BIT) IMPLANT
BNDG COHESIVE 4X5 TAN STRL LF (GAUZE/BANDAGES/DRESSINGS) ×2 IMPLANT
BNDG COHESIVE 6X5 TAN ST LF (GAUZE/BANDAGES/DRESSINGS) IMPLANT
BNDG GAUZE DERMACEA FLUFF 4 (GAUZE/BANDAGES/DRESSINGS) ×2 IMPLANT
COVER PERINEAL POST (MISCELLANEOUS) ×2 IMPLANT
COVER SURGICAL LIGHT HANDLE (MISCELLANEOUS) ×2 IMPLANT
DRAPE C-ARMOR (DRAPES) ×2 IMPLANT
DRAPE STERI IOBAN 125X83 (DRAPES) ×2 IMPLANT
DRESSING MEPILEX FLEX 4X4 (GAUZE/BANDAGES/DRESSINGS) ×2 IMPLANT
DRSG MEPILEX POST OP 4X8 (GAUZE/BANDAGES/DRESSINGS) ×2 IMPLANT
DRSG TEGADERM 4X4.75 (GAUZE/BANDAGES/DRESSINGS) IMPLANT
DURAPREP 26ML APPLICATOR (WOUND CARE) ×2 IMPLANT
ELECTRODE REM PT RTRN 9FT ADLT (ELECTROSURGICAL) ×2 IMPLANT
GAUZE PAD ABD 8X10 STRL (GAUZE/BANDAGES/DRESSINGS) ×4 IMPLANT
GAUZE SPONGE 4X4 12PLY STRL (GAUZE/BANDAGES/DRESSINGS) IMPLANT
GAUZE XEROFORM 5X9 LF (GAUZE/BANDAGES/DRESSINGS) IMPLANT
GLOVE BIOGEL PI IND STRL 7.0 (GLOVE) ×4 IMPLANT
GLOVE BIOGEL PI IND STRL 7.5 (GLOVE) ×2 IMPLANT
GLOVE ECLIPSE 7.0 STRL STRAW (GLOVE) ×2 IMPLANT
GLOVE SKINSENSE STRL SZ7.5 (GLOVE) ×4 IMPLANT
GLOVE SURG SYN 7.5 PF PI (GLOVE) ×4 IMPLANT
GLOVE SURG UNDER POLY LF SZ7 (GLOVE) ×38 IMPLANT
GLOVE SURG UNDER POLY LF SZ7.5 (GLOVE) ×8 IMPLANT
GOWN STRL SURGICAL XL XLNG (GOWN DISPOSABLE) ×2 IMPLANT
KIT BASIN OR (CUSTOM PROCEDURE TRAY) ×2 IMPLANT
KIT TURNOVER KIT B (KITS) ×2 IMPLANT
MANIFOLD NEPTUNE II (INSTRUMENTS) ×2 IMPLANT
NAIL TRIGEN 10MMX20CM HIP IMPLANT
NS IRRIG 1000ML POUR BTL (IV SOLUTION) ×2 IMPLANT
PACK GENERAL/GYN (CUSTOM PROCEDURE TRAY) ×2 IMPLANT
PAD ARMBOARD POSITIONER FOAM (MISCELLANEOUS) ×4 IMPLANT
PAD CAST 4YDX4 CTTN HI CHSV (CAST SUPPLIES) ×4 IMPLANT
PIN GUIDE 3.2X343MM (PIN) IMPLANT
SCREW LAG COMPR KIT 85/80 (Screw) IMPLANT
SCREW TRIGEN LOW PROF 5.0X32.5 (Screw) IMPLANT
STAPLER SKIN PROX 35W (STAPLE) ×2 IMPLANT
SUT VIC AB 0 CT1 27XBRD ANBCTR (SUTURE) ×2 IMPLANT
SUT VIC AB 2-0 CT1 TAPERPNT 27 (SUTURE) ×2 IMPLANT
TOWEL GREEN STERILE (TOWEL DISPOSABLE) ×2 IMPLANT
TOWEL GREEN STERILE FF (TOWEL DISPOSABLE) ×2 IMPLANT
WATER STERILE IRR 1000ML POUR (IV SOLUTION) ×2 IMPLANT

## 2023-09-03 NOTE — Plan of Care (Signed)

## 2023-09-03 NOTE — Transfer of Care (Signed)
 Immediate Anesthesia Transfer of Care Note  Patient: Gloria Massey  Procedure(s) Performed: FIXATION, FRACTURE, INTERTROCHANTERIC, WITH INTRAMEDULLARY ROD (Left)  Patient Location: PACU  Anesthesia Type:General  Level of Consciousness: awake and sedated  Airway & Oxygen Therapy: Patient connected to nasal cannula oxygen  Post-op Assessment: Report given to RN and Post -op Vital signs reviewed and stable  Post vital signs: Reviewed and stable  Last Vitals:  Vitals Value Taken Time  BP 193/79 09/03/23 15:15  Temp 36.6 C 09/03/23 15:15  Pulse 100 09/03/23 15:19  Resp 14 09/03/23 15:19  SpO2 93 % 09/03/23 15:19  Vitals shown include unfiled device data.  Last Pain:  Vitals:   09/03/23 1515  TempSrc:   PainSc: 0-No pain         Complications: No notable events documented.

## 2023-09-03 NOTE — Discharge Instructions (Signed)
    1. Change dressings as needed 2. May shower but keep incisions covered and dry 3. Take your prescribed blood thinner to prevent blood clots 4. Take stool softeners as needed 5. Take pain meds as needed  I have reviewed the patient's history and given the presence of a fragility fracture, I have deemed the necessity of a osteoporosis management referral or confirmed that the patient is currently enrolled in a osteoporosis treatment program.

## 2023-09-03 NOTE — Anesthesia Procedure Notes (Signed)
 Procedure Name: Intubation Date/Time: 09/03/2023 2:12 PM  Performed by: Cena Epps, CRNAPre-anesthesia Checklist: Patient identified, Emergency Drugs available, Suction available and Patient being monitored Patient Re-evaluated:Patient Re-evaluated prior to induction Oxygen Delivery Method: Circle System Utilized Preoxygenation: Pre-oxygenation with 100% oxygen Induction Type: IV induction Ventilation: Mask ventilation without difficulty and Oral airway inserted - appropriate to patient size Laryngoscope Size: Glidescope and 3 (C6 fracture) Tube type: Oral Tube size: 7.0 mm Number of attempts: 1 Airway Equipment and Method: Stylet and Oral airway Placement Confirmation: ETT inserted through vocal cords under direct vision, positive ETCO2 and breath sounds checked- equal and bilateral Secured at: 22 cm Tube secured with: Tape Dental Injury: Teeth and Oropharynx as per pre-operative assessment
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
## 2023-09-03 NOTE — Op Note (Signed)
   Date of Surgery: 09/03/2023  INDICATIONS: Gloria Massey is a 83 y.o.-year-old female who sustained a left hip fracture. The risks and benefits of the procedure discussed with the patient prior to the procedure and all questions were answered; consent was obtained.  PREOPERATIVE DIAGNOSIS: left peritrochanteric hip fracture   POSTOPERATIVE DIAGNOSIS: Same   PROCEDURE: Open treatment of intertrochanteric, pertrochanteric, subtrochanteric fracture with intramedullary implant. CPT [phone]   SURGEON: N. Ozell Massey, M.D.   ASSIST: Gloria Massey, [place]  ANESTHESIA: general   IV FLUIDS AND URINE: See anesthesia record   ESTIMATED BLOOD LOSS: 150 cc  IMPLANTS: [employer] InterTAN 10 x 200 mm, 85/80 dual compression screws  DRAINS: None.   COMPLICATIONS: see description of procedure.   DESCRIPTION OF PROCEDURE: The patient was brought to the operating room and placed supine on the operating table. The patient's leg had been signed prior to the procedure. The patient had the anesthesia placed by the anesthesiologist. The prep verification and incision time-outs were performed to confirm that this was the correct patient, site, side and location. The patient had an SCD on the opposite lower extremity. The patient did receive antibiotics prior to the incision and was re-dosed during the procedure as needed at indicated intervals. The patient was positioned on the fracture table with the table in traction and internal rotation to reduce the hip. The well leg was placed in a scissor position and all bony prominences were well-padded. The patient had the lower extremity prepped and draped in the standard surgical fashion. The incision was made 4 finger breadths superior to the greater trochanter. A guide pin was inserted into the tip of the greater trochanter under fluoroscopic guidance. An opening reamer was used to gain access to the femoral canal. The nail length was measured and inserted  down the femoral canal to its proper depth. The appropriate version of insertion for the lag screw was found under fluoroscopy. A pin was inserted up the femoral neck through the jig. Then, a second antirotation pin was inserted inferior to the first pin. The length of the lag screw was then measured. The lag screw was inserted as near to center-center in the head as possible. The antirotation pin was then taken out and an interdigitating compression screw was placed in its place. A distal interlocking screw was placed through the static slot in the jig.  The wounds were copiously irrigated with saline and the subcutaneous layer closed with 2.0 vicryl and the skin was reapproximated with staples. The wounds were cleaned and dried a final time and a sterile dressing was placed. The hip was taken through a range of motion at the end of the case under fluoroscopic imaging to visualize the approach-withdraw phenomenon and confirm implant length in the head. The patient was then awakened from anesthesia and taken to the recovery room in stable condition. All counts were correct at the end of the case.   Gloria Massey was necessary for opening, closing, retracting, limb positioning and overall facilitation and completion of the surgery.  POSTOPERATIVE PLAN: The patient will be weight bearing as tolerated and will return in 2 weeks for staple removal and the patient will receive DVT prophylaxis based on other medications, activity level, and risk ratio of bleeding to thrombosis.   Gloria Ozell Cummins, MD [facility] 12:52 PM

## 2023-09-03 NOTE — Anesthesia Postprocedure Evaluation (Signed)
 Anesthesia Post Note  Patient: Gloria Massey  Procedure(s) Performed: FIXATION, FRACTURE, INTERTROCHANTERIC, WITH INTRAMEDULLARY ROD (Left)     Patient location during evaluation: PACU Anesthesia Type: General Level of consciousness: awake and alert, oriented and patient cooperative Pain management: pain level controlled Vital Signs Assessment: post-procedure vital signs reviewed and stable Respiratory status: spontaneous breathing, nonlabored ventilation, respiratory function stable and patient connected to nasal cannula oxygen Cardiovascular status: blood pressure returned to baseline and stable Postop Assessment: no apparent nausea or vomiting Anesthetic complications: no   No notable events documented.  Last Vitals:  Vitals:   09/03/23 1615 09/03/23 1628  BP: (!) 164/53 (!) 152/74  Pulse: 94 93  Resp: 17   Temp: 36.7 C   SpO2: 93% 100%    Last Pain:  Vitals:   09/03/23 1615  TempSrc:   PainSc: Asleep                 Uchenna Seufert,E. Castiel Lauricella

## 2023-09-04 DIAGNOSIS — W19XXXA Unspecified fall, initial encounter: Secondary | ICD-10-CM | POA: Diagnosis not present

## 2023-09-04 DIAGNOSIS — S72002A Fracture of unspecified part of neck of left femur, initial encounter for closed fracture: Secondary | ICD-10-CM | POA: Diagnosis not present

## 2023-09-04 DIAGNOSIS — S72142A Displaced intertrochanteric fracture of left femur, initial encounter for closed fracture: Secondary | ICD-10-CM | POA: Diagnosis not present

## 2023-09-04 LAB — CBC
HCT: 29.4 % — ABNORMAL LOW (ref 36.0–46.0)
Hemoglobin: 9.8 g/dL — ABNORMAL LOW (ref 12.0–15.0)
MCH: 29.8 pg (ref 26.0–34.0)
MCHC: 33.3 g/dL (ref 30.0–36.0)
MCV: 89.4 fL (ref 80.0–100.0)
Platelets: 278 K/uL (ref 150–400)
RBC: 3.29 MIL/uL — ABNORMAL LOW (ref 3.87–5.11)
RDW: 14.6 % (ref 11.5–15.5)
WBC: 11.8 K/uL — ABNORMAL HIGH (ref 4.0–10.5)
nRBC: 0 % (ref 0.0–0.2)

## 2023-09-04 LAB — BASIC METABOLIC PANEL WITH GFR
Anion gap: 12 (ref 5–15)
BUN: 12 mg/dL (ref 8–23)
CO2: 22 mmol/L (ref 22–32)
Calcium: 9.3 mg/dL (ref 8.9–10.3)
Chloride: 103 mmol/L (ref 98–111)
Creatinine, Ser: 1.01 mg/dL — ABNORMAL HIGH (ref 0.44–1.00)
GFR, Estimated: 55 mL/min — ABNORMAL LOW (ref >60)
Glucose, Bld: 120 mg/dL — ABNORMAL HIGH (ref 70–99)
Potassium: 4.3 mmol/L (ref 3.5–5.1)
Sodium: 137 mmol/L (ref 135–145)
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
## 2023-09-05 DIAGNOSIS — S72002A Fracture of unspecified part of neck of left femur, initial encounter for closed fracture: Secondary | ICD-10-CM | POA: Diagnosis not present

## 2023-09-05 DIAGNOSIS — W19XXXA Unspecified fall, initial encounter: Secondary | ICD-10-CM | POA: Diagnosis not present

## 2023-09-05 DIAGNOSIS — S72142A Displaced intertrochanteric fracture of left femur, initial encounter for closed fracture: Secondary | ICD-10-CM | POA: Diagnosis not present

## 2023-09-05 DIAGNOSIS — E039 Hypothyroidism, unspecified: Secondary | ICD-10-CM | POA: Diagnosis not present

## 2023-09-05 LAB — CBC
HCT: 28.5 % — ABNORMAL LOW (ref 36.0–46.0)
Hemoglobin: 9.5 g/dL — ABNORMAL LOW (ref 12.0–15.0)
MCH: 30.3 pg (ref 26.0–34.0)
MCHC: 33.3 g/dL (ref 30.0–36.0)
MCV: 90.8 fL (ref 80.0–100.0)
Platelets: 290 K/uL (ref 150–400)
RBC: 3.14 MIL/uL — ABNORMAL LOW (ref 3.87–5.11)
RDW: 14.7 % (ref 11.5–15.5)
WBC: 9.3 K/uL (ref 4.0–10.5)
nRBC: 0 % (ref 0.0–0.2)

## 2023-09-05 LAB — BASIC METABOLIC PANEL WITH GFR
Anion gap: 8 (ref 5–15)
BUN: 10 mg/dL (ref 8–23)
CO2: 26 mmol/L (ref 22–32)
Calcium: 8.8 mg/dL — ABNORMAL LOW (ref 8.9–10.3)
Chloride: 104 mmol/L (ref 98–111)
Creatinine, Ser: 0.83 mg/dL (ref 0.44–1.00)
GFR, Estimated: >60 mL/min (ref >60)
Glucose, Bld: 92 mg/dL (ref 70–99)
Potassium: 3.8 mmol/L (ref 3.5–5.1)
Sodium: 138 mmol/L (ref 135–145)

## 2023-09-05 LAB — VITAMIN D 25 HYDROXY (VIT D DEFICIENCY, FRACTURES): Vit D, 25-Hydroxy: 80.7 ng/mL (ref 30–100)

## 2023-09-05 MED ORDER — HYDROCODONE-ACETAMINOPHEN 5-325 MG PO TABS: 1.0000 | ORAL_TABLET | Freq: Four times a day (QID) | ORAL | 0 refills | Status: DC | PRN

## 2023-09-05 MED ORDER — ENSURE PLUS HIGH PROTEIN PO LIQD
237.0000 mL | Freq: Two times a day (BID) | ORAL | Status: DC
  Administered 2023-09-05 – 2023-09-08 (×5): 237 mL via ORAL

## 2023-09-05 MED ORDER — ENOXAPARIN SODIUM 40 MG/0.4ML IJ SOSY: 40.0000 mg | PREFILLED_SYRINGE | INTRAMUSCULAR | 0 refills | Status: AC

## 2023-09-05 MED ORDER — ACETAMINOPHEN 500 MG PO TABS
1000.0000 mg | ORAL_TABLET | Freq: Three times a day (TID) | ORAL | Status: AC
  Administered 2023-09-05 – 2023-09-07 (×6): 1000 mg via ORAL
  Filled 2023-09-05 (×8): qty 2

## 2023-09-05 NOTE — Progress Notes (Signed)
 Today's  total administered Morphine  Milligram Equivalents: 0Subjective: 2 Days Post-Op Procedure(s) (LRB): FIXATION, FRACTURE, INTERTROCHANTERIC, WITH INTRAMEDULLARY ROD (Left) Patient reports pain as moderate.    Objective: Vital signs in last 24 hours: Temp:  [97.9 F (36.6 C)-98.3 F (36.8 C)] 98.1 F (36.7 C) (08/04 0534) Pulse Rate:  [88-97] 88 (08/04 0534) Resp:  [16-19] 18 (08/04 0601) BP: (124-151)/(52-95) 151/57 (08/04 0534) SpO2:  [94 %-100 %] 100 % (08/04 0534)  Intake/Output from previous day: 08/03 0701 - 08/04 0700 In: 360 [P.O.:360] Out: 2450 [Urine:2450] Intake/Output this shift: No intake/output data recorded.  Recent Labs    09/02/23 2148 09/03/23 0800 09/03/23 1700 09/04/23 0617  HGB 12.1 12.1 10.6* 9.8*   Recent Labs    09/03/23 1700 09/04/23 0617  WBC 14.4* 11.8*  RBC 3.60* 3.29*  HCT 32.7* 29.4*  PLT 305 278   Recent Labs    09/03/23 1700 09/04/23 0617  NA 137 137  K 3.9 4.3  CL 101 103  CO2 27 22  BUN 13 12  CREATININE 0.85 1.01*  GLUCOSE 140* 120*  CALCIUM 8.9 9.3   Recent Labs    09/02/23 2148  INR 1.0    Neurologically intact Neurovascular intact Sensation intact distally Intact pulses distally Dorsiflexion/Plantar flexion intact Incision: dressing C/D/I No cellulitis present Compartment soft   Assessment/Plan: 2 Days Post-Op Procedure(s) (LRB): FIXATION, FRACTURE, INTERTROCHANTERIC, WITH INTRAMEDULLARY ROD (Left) Up with therapy Weightbearing: WBAT LLE Insicional and dressing care: Dressings left intact until follow-up Orthopedic device(s): None Showering: pod#3 with bandage VTE prophylaxis: Lovenox  40mg  qd x 2 weeks Pain control: norco Follow - up plan: 2 weeks Contact information:  xu MD, jule PA      Ronal LITTIE jule 09/05/2023, 7:15 AM
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
## 2023-09-05 NOTE — Plan of Care (Signed)
°  Problem: Education: °Goal: Knowledge of General Education information will improve °Description: Including pain rating scale, medication(s)/side effects and non-pharmacologic comfort measures °Outcome: Progressing °  °Problem: Health Behavior/Discharge Planning: °Goal: Ability to manage health-related needs will improve °Outcome: Progressing °  °Problem: Clinical Measurements: °Goal: Ability to maintain clinical measurements within normal limits will improve °Outcome: Progressing °Goal: Will remain free from infection °Outcome: Progressing °  °Problem: Nutrition: °Goal: Adequate nutrition will be maintained °Outcome: Progressing °  °Problem: Coping: °Goal: Level of anxiety will decrease °Outcome: Progressing °  °Problem: Safety: °Goal: Ability to remain free from injury will improve °Outcome: Progressing °  °

## 2023-09-06 ENCOUNTER — Encounter (HOSPITAL_COMMUNITY): Payer: Self-pay | Admitting: Orthopaedic Surgery

## 2023-09-06 DIAGNOSIS — S72002A Fracture of unspecified part of neck of left femur, initial encounter for closed fracture: Secondary | ICD-10-CM | POA: Diagnosis not present

## 2023-09-06 DIAGNOSIS — E038 Other specified hypothyroidism: Secondary | ICD-10-CM | POA: Diagnosis not present

## 2023-09-06 DIAGNOSIS — S72142A Displaced intertrochanteric fracture of left femur, initial encounter for closed fracture: Secondary | ICD-10-CM | POA: Diagnosis not present

## 2023-09-06 DIAGNOSIS — W19XXXA Unspecified fall, initial encounter: Secondary | ICD-10-CM | POA: Diagnosis not present

## 2023-09-06 LAB — CBC
HCT: 28.5 % — ABNORMAL LOW (ref 36.0–46.0)
Hemoglobin: 9.4 g/dL — ABNORMAL LOW (ref 12.0–15.0)
MCH: 29.9 pg (ref 26.0–34.0)
MCHC: 33 g/dL (ref 30.0–36.0)
MCV: 90.8 fL (ref 80.0–100.0)
Platelets: 292 K/uL (ref 150–400)
RBC: 3.14 MIL/uL — ABNORMAL LOW (ref 3.87–5.11)
RDW: 14.6 % (ref 11.5–15.5)
WBC: 8 K/uL (ref 4.0–10.5)
nRBC: 0 % (ref 0.0–0.2)

## 2023-09-06 LAB — BASIC METABOLIC PANEL WITH GFR
Anion gap: 7 (ref 5–15)
BUN: 11 mg/dL (ref 8–23)
CO2: 26 mmol/L (ref 22–32)
Calcium: 8.7 mg/dL — ABNORMAL LOW (ref 8.9–10.3)
Chloride: 103 mmol/L (ref 98–111)
Creatinine, Ser: 0.77 mg/dL (ref 0.44–1.00)
GFR, Estimated: >60 mL/min (ref >60)
Glucose, Bld: 102 mg/dL — ABNORMAL HIGH (ref 70–99)
Potassium: 4.1 mmol/L (ref 3.5–5.1)
Sodium: 136 mmol/L (ref 135–145)
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
## 2023-09-06 NOTE — Plan of Care (Signed)

## 2023-09-06 NOTE — TOC Initial Note (Signed)
 Transition of Care [facility]) - Initial/Assessment Note    Patient Details  Name: Gloria Massey MRN: [id number] Date of Birth: [birth year]  Transition of Care [facility]) CM/SW Contact:    Rosalva Jon Bloch, RN Phone Number: 09/06/2023, 1:43 PM  Clinical Narrative:                 - S/p subtrochanteric fracture with intramedullary implant on 09/03/23  From home with family, pt states active with [facility] and would like to continue @ d/c, NCM made Angie with [facility] aware. Resumption order for home health services will be needed for home health services. Pt without DME needs. Family to assist with care once home. Pt without RX med concerns. Husband to provide transportation to home.  TOC team following and will assist with needs....  Expected Discharge Plan: Home w Home Health Services Barriers to Discharge: Continued Medical Work up   Patient Goals and CMS Choice     Choice offered to / list presented to : Patient      Expected Discharge Plan and Services   Discharge Planning Services: CM Consult   Living arrangements for the past 2 months: Single Family Home                           HH Arranged: PT, OT HH Agency: [facility] Date [facility] Agency Contacted: 09/06/23 Time HH Agency Contacted: [id number] Representative spoke with at [facility] Agency: Angie  Prior Living Arrangements/Services Living arrangements for the past 2 months: Single Family Home   Patient language and need for interpreter reviewed:: Yes Do you feel safe going back to the place where you live?: Yes      Need for Family Participation in Patient Care: Yes (Comment) Care giver support system in place?: Yes (comment) Current home services: DME (RW, shower bencch, BSC, W/C) Criminal Activity/Legal Involvement Pertinent to Current Situation/Hospitalization: No - Comment as needed  Activities of Daily Living   ADL Screening (condition at time of admission) Independently performs ADLs?: Yes (appropriate  for developmental age) Is the patient deaf or have difficulty hearing?: No Does the patient have difficulty seeing, even when wearing glasses/contacts?: No Does the patient have difficulty concentrating, remembering, or making decisions?: No  Permission Sought/Granted                  Emotional Assessment       Orientation: : Oriented to Self, Oriented to Place, Oriented to  Time, Oriented to Situation Alcohol / Substance Use: Not Applicable Psych Involvement: No (comment)  Admission diagnosis:  Closed left femoral fracture (HCC) [S72.92XA] Patient Active Problem List   Diagnosis Date Noted   Displaced intertrochanteric fracture of left femur, initial encounter for closed fracture (HCC) 09/03/2023   Closed left femoral fracture (HCC) 09/02/2023   Osteoporosis 09/02/2023   Dyslipidemia 09/02/2023   Hypothyroidism 09/02/2023   Injury of cervical spine (HCC) 09/02/2023   PCP:  Teresa Aldona CROME, NP Pharmacy:   [facility] [address], [place] - [address] [place] [address] Phone: [phone] Fax: [phone]     Social Drivers of Health (SDOH) Social History: SDOH Screenings   Food Insecurity: No Food Insecurity (09/03/2023)  Housing: Unknown (09/03/2023)  Transportation Needs: No Transportation Needs (09/03/2023)  Utilities: Not At Risk (09/03/2023)  Financial Resource Strain: Low Risk  (04/05/2023)   Received from [facility]  Physical Activity: Unknown (01/24/2023)   Received from [facility]  Social Connections: Socially Integrated (09/03/2023)  Stress: No Stress Concern Present (01/24/2023)   Received from [facility]  Tobacco Use: High Risk (09/03/2023)   SDOH Interventions: Food Insecurity Interventions: Intervention Not Indicated Utilities Interventions: Intervention Not Indicated Social Connections Interventions: Intervention Not Indicated   Readmission Risk Interventions     No data to display

## 2023-09-06 NOTE — Plan of Care (Signed)

## 2023-09-06 NOTE — Progress Notes (Signed)
   Subjective:  Patient reports pain as moderate.    Today's  total administered Morphine  Milligram Equivalents: 15  Objective:   VITALS:   Vitals:   09/05/23 1637 09/06/23 0516 09/06/23 0744 09/06/23 1354  BP: (!) 127/47 (!) 169/56 (!) 144/58 (!) 113/36  Pulse: 85 87 81 83  Resp: 16 18 18 16   Temp: 98.4 F (36.9 C) 98.2 F (36.8 C) 97.8 F (36.6 C) 98.3 F (36.8 C)  TempSrc:   Oral   SpO2: 95% 97% 97% 98%  Weight:      Height:        Left hip exam unremarkable   Lab Results  Component Value Date   WBC 8.0 09/06/2023   HGB 9.4 (L) 09/06/2023   HCT 28.5 (L) 09/06/2023   MCV 90.8 09/06/2023   PLT 292 09/06/2023     Assessment/Plan:  3 Days Post-Op   - stable from ortho stand point - patient and her sons do not feel comfortable with going home, needs consult with SW to discuss SNF placement  Ozell Cummins 09/06/2023, 3:53 PM

## 2023-09-07 DIAGNOSIS — S14109D Unspecified injury at unspecified level of cervical spinal cord, subsequent encounter: Secondary | ICD-10-CM | POA: Diagnosis not present

## 2023-09-07 DIAGNOSIS — E039 Hypothyroidism, unspecified: Secondary | ICD-10-CM | POA: Diagnosis not present

## 2023-09-07 DIAGNOSIS — M810 Age-related osteoporosis without current pathological fracture: Secondary | ICD-10-CM | POA: Diagnosis not present

## 2023-09-07 DIAGNOSIS — S72142A Displaced intertrochanteric fracture of left femur, initial encounter for closed fracture: Secondary | ICD-10-CM | POA: Diagnosis not present

## 2023-09-07 LAB — BASIC METABOLIC PANEL WITH GFR
Anion gap: 9 (ref 5–15)
BUN: 15 mg/dL (ref 8–23)
CO2: 26 mmol/L (ref 22–32)
Calcium: 9.3 mg/dL (ref 8.9–10.3)
Chloride: 102 mmol/L (ref 98–111)
Creatinine, Ser: 0.77 mg/dL (ref 0.44–1.00)
GFR, Estimated: >60 mL/min (ref >60)
Glucose, Bld: 90 mg/dL (ref 70–99)
Potassium: 4.1 mmol/L (ref 3.5–5.1)
Sodium: 137 mmol/L (ref 135–145)

## 2023-09-07 LAB — CBC
HCT: 30.5 % — ABNORMAL LOW (ref 36.0–46.0)
Hemoglobin: 10.1 g/dL — ABNORMAL LOW (ref 12.0–15.0)
MCH: 30.1 pg (ref 26.0–34.0)
MCHC: 33.1 g/dL (ref 30.0–36.0)
MCV: 90.8 fL (ref 80.0–100.0)
Platelets: 372 K/uL (ref 150–400)
RBC: 3.36 MIL/uL — ABNORMAL LOW (ref 3.87–5.11)
RDW: 14.6 % (ref 11.5–15.5)
WBC: 8.6 K/uL (ref 4.0–10.5)
nRBC: 0 % (ref 0.0–0.2)
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
## 2023-09-07 NOTE — TOC Progression Note (Addendum)
 Transition of Care [facility]) - Progression Note    Patient Details  Name: Gloria Massey MRN: [id number] Date of Birth: 02-10-1940  Transition of Care [facility]) CM/SW Contact  Lendia Dais, [place] Phone Number: 09/07/2023, 1:35 PM  Clinical Narrative: CSW spoke to pt about SNF pt recs and pt stated that she would be willing to go to a SNF and asked for CSW to contact her son [name] about picking a SNF.   CSW spoke to the son [name] and he was agreeable to the patient attending a skilled nursing facility. [name] requested [facility] due the pt being there previously.   Referral sent out in the HUB for SNF.   1515 - CSW spoke to son [name] about bed offer at [facility]. CSW informed abby that the pt is medically stable per DO. [name] was agreeable to dc plan and was informed that insurance auth would need to be completed before dc.  CSW spoke to [place] at [facility] and stated that they would have a bed for the pt tomorrow once shara is approved.  CSW started serbia in [place]. Auth ID [id number]     Expected Discharge Plan: Skilled Nursing Facility Barriers to Discharge: Continued Medical Work up               Expected Discharge Plan and Services   Discharge Planning Services: CM Consult   Living arrangements for the past 2 months: Single Family Home                           HH Arranged: PT, OT [facility] Agency: [facility] Date [facility] Agency Contacted: 09/06/23 Time HH Agency Contacted: [phone] Representative spoke with at [facility] Agency: Angie   Social Drivers of Health (SDOH) Interventions SDOH Screenings   Food Insecurity: No Food Insecurity (09/03/2023)  Housing: Unknown (09/03/2023)  Transportation Needs: No Transportation Needs (09/03/2023)  Utilities: Not At Risk (09/03/2023)  Financial Resource Strain: Low Risk  (04/05/2023)   Received from [facility]  Physical Activity: Unknown (01/24/2023)   Received from [facility]  Social Connections: Socially Integrated  (09/03/2023)  Stress: No Stress Concern Present (01/24/2023)   Received from [facility]  Tobacco Use: High Risk (09/03/2023)    Readmission Risk Interventions     No data to display
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
## 2023-09-07 NOTE — Plan of Care (Signed)

## 2023-09-07 NOTE — TOC Progression Note (Signed)
 Transition of Care [facility]) - Progression Note    Patient Details  Name: Gloria Massey MRN: [id number] Date of Birth: 05/10/1940  Transition of Care [facility]) CM/SW Contact  Rosalva Jon Bloch, RN Phone Number: 09/07/2023, 10:31 AM  Clinical Narrative:    NCM spoke with pt @ bedside with son [name] on speaker phone per pt's request. [name] stated family would like for pt to transition to STR @ d/c, however, pt's preference is to go home. [name] states he and his brother are coming to hospital this am to discuss best plan with pt, STR. PT session pending. NCM provided pt with Medicare SNF star rating list, pt to share with sons.  TOC team following and will continue assisting with needs....    Expected Discharge Plan: Skilled Nursing Facility Barriers to Discharge: Continued Medical Work up               Expected Discharge Plan and Services   Discharge Planning Services: CM Consult   Living arrangements for the past 2 months: Single Family Home                           HH Arranged: PT, OT HH Agency: [facility] Date [facility] Agency Contacted: 09/06/23 Time HH Agency Contacted: 1339 Representative spoke with at [facility] Agency: Angie   Social Drivers of Health (SDOH) Interventions SDOH Screenings   Food Insecurity: No Food Insecurity (09/03/2023)  Housing: Unknown (09/03/2023)  Transportation Needs: No Transportation Needs (09/03/2023)  Utilities: Not At Risk (09/03/2023)  Financial Resource Strain: Low Risk  (04/05/2023)   Received from [facility]  Physical Activity: Unknown (01/24/2023)   Received from [facility]  Social Connections: Socially Integrated (09/03/2023)  Stress: No Stress Concern Present (01/24/2023)   Received from [facility]  Tobacco Use: High Risk (09/03/2023)    Readmission Risk Interventions     No data to display

## 2023-09-08 DIAGNOSIS — S72142A Displaced intertrochanteric fracture of left femur, initial encounter for closed fracture: Secondary | ICD-10-CM | POA: Diagnosis not present

## 2023-09-08 DIAGNOSIS — M810 Age-related osteoporosis without current pathological fracture: Secondary | ICD-10-CM | POA: Diagnosis not present

## 2023-09-08 DIAGNOSIS — S14109D Unspecified injury at unspecified level of cervical spinal cord, subsequent encounter: Secondary | ICD-10-CM | POA: Diagnosis not present

## 2023-09-08 LAB — BASIC METABOLIC PANEL WITH GFR
Anion gap: 9 (ref 5–15)
BUN: 16 mg/dL (ref 8–23)
CO2: 23 mmol/L (ref 22–32)
Calcium: 9.1 mg/dL (ref 8.9–10.3)
Chloride: 104 mmol/L (ref 98–111)
Creatinine, Ser: 0.94 mg/dL (ref 0.44–1.00)
GFR, Estimated: >60 mL/min (ref >60)
Glucose, Bld: 98 mg/dL (ref 70–99)
Potassium: 4.4 mmol/L (ref 3.5–5.1)
Sodium: 136 mmol/L (ref 135–145)

## 2023-09-08 LAB — CBC
HCT: 30.2 % — ABNORMAL LOW (ref 36.0–46.0)
Hemoglobin: 9.9 g/dL — ABNORMAL LOW (ref 12.0–15.0)
MCH: 30.1 pg (ref 26.0–34.0)
MCHC: 32.8 g/dL (ref 30.0–36.0)
MCV: 91.8 fL (ref 80.0–100.0)
Platelets: 350 K/uL (ref 150–400)
RBC: 3.29 MIL/uL — ABNORMAL LOW (ref 3.87–5.11)
RDW: 14.5 % (ref 11.5–15.5)
WBC: 8.5 K/uL (ref 4.0–10.5)
nRBC: 0 % (ref 0.0–0.2)

## 2023-09-08 MED ORDER — LEVOTHYROXINE SODIUM 75 MCG PO TABS: 75.0000 ug | ORAL_TABLET | Freq: Every day | ORAL | Status: AC

## 2023-09-08 MED ORDER — HYDROCODONE-ACETAMINOPHEN 5-325 MG PO TABS: 1.0000 | ORAL_TABLET | Freq: Four times a day (QID) | ORAL | 0 refills | Status: AC | PRN

## 2023-09-08 NOTE — Progress Notes (Signed)
 Patient discharging to SNF [facility], report given to nurse Olam, discharge packet left for ptar to pick up along with prescriptions, iv access removed.

## 2023-09-08 NOTE — TOC Transition Note (Signed)
 Transition of Care [facility]) - Discharge Note   Patient Details  Name: Gloria Massey MRN: [id number] Date of Birth: Jun 03, 1940  Transition of Care [facility]) CM/SW Contact:  Gwenn Julien Norris, [place] Phone Number: 09/08/2023, 11:27 AM   Clinical Narrative: Pt for dc to [facility] today. Home [facility] Community/[employer] auth received [id number], next review 8/11) and details provided to Debbie in admissions who confirmed they are prepared to admit pt to room A9-2. Pt's son [name] aware of dc and reports agreeable. RN provided with number for report and PTAR arranged for transport. SW signing off at dc.   Julien Gwenn, MSW, LCSW [phone] (coverage)        Final next level of care: Skilled Nursing Facility Barriers to Discharge: Barriers Resolved   Patient Goals and CMS Choice     Choice offered to / list presented to : Patient      Discharge Placement                Patient to be transferred to facility by: PTAR Name of family member notified: Kenny/son Patient and family notified of of transfer: 09/08/23  Discharge Plan and Services Additional resources added to the After Visit Summary for     Discharge Planning Services: CM Consult                      HH Arranged: PT, OT HH Agency: Other - See comment [facility]) Date [facility] Agency Contacted: 09/06/23 Time HH Agency Contacted: 1339 Representative spoke with at [facility] Agency: Angie  Social Drivers of Health (SDOH) Interventions SDOH Screenings   Food Insecurity: No Food Insecurity (09/03/2023)  Housing: Unknown (09/03/2023)  Transportation Needs: No Transportation Needs (09/03/2023)  Utilities: Not At Risk (09/03/2023)  Financial Resource Strain: Low Risk  (04/05/2023)   Received from [facility]  Physical Activity: Unknown (01/24/2023)   Received from [facility]  Social Connections: Socially Integrated (09/03/2023)  Stress: No Stress Concern Present (01/24/2023)   Received from [facility]  Tobacco Use: High Risk  (09/03/2023)     Readmission Risk Interventions     No data to display
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
## 2023-09-09 MED FILL — Fentanyl Citrate Preservative Free (PF) Inj 100 MCG/2ML: INTRAMUSCULAR | Qty: 2 | Status: AC

## 2023-10-04 ENCOUNTER — Ambulatory Visit (INDEPENDENT_AMBULATORY_CARE_PROVIDER_SITE_OTHER): Admitting: Orthopaedic Surgery

## 2023-10-04 ENCOUNTER — Other Ambulatory Visit (INDEPENDENT_AMBULATORY_CARE_PROVIDER_SITE_OTHER)

## 2023-10-04 DIAGNOSIS — S72142A Displaced intertrochanteric fracture of left femur, initial encounter for closed fracture: Secondary | ICD-10-CM

## 2023-10-04 NOTE — Progress Notes (Signed)
   Post-Op Visit Note   Patient: Gloria Massey           Date of Birth: 27-Mar-1940           MRN: [id number] Visit Date: 10/04/2023 PCP: Teresa Aldona CROME, NP   Assessment & Plan:  Chief Complaint:  Chief Complaint  Patient presents with   Left Hip - Follow-up    Left hip IM rod 09/03/2023   Visit Diagnoses:  1. Displaced intertrochanteric fracture of left femur, initial encounter for closed fracture [facility])     Plan: History of Present Illness Gloria Massey is an 83 year old female who presents for 4-week postop visit.  She is four weeks post-operative from hip surgery and experiences pain when walking, particularly during physical therapy. Pain has decreased following the removal of staples. She is actively participating in physical therapy, which includes walking, using weights on her legs, riding a bicycle, and climbing stairs.  Physical Exam SKIN: Incisions on leg are well-healed. Bruising present on leg.  No significant pain with hip movement.  Assessment and Plan Displaced intertrochanteric fracture of left femur Four weeks post-surgery with expected healing progression. No implant issues. Bruising likely from initial fracture. - Continue physical therapy and rehabilitation exercises. - Schedule follow-up appointment in four weeks for x-rays to assess healing progress. - Allow steri-strips to be peeled off in one week.  Osteoarthritis of left hip Pre-existing condition potentially exacerbating post-surgical discomfort. - Continue physical therapy to improve hip strength and mobility.  Follow-Up Instructions: Return in about 4 weeks (around 11/01/2023).   Orders:  Orders Placed This Encounter  Procedures   XR FEMUR MIN 2 VIEWS LEFT   No orders of the defined types were placed in this encounter.   Imaging: No results found.  PMFS History: Patient Active Problem List   Diagnosis Date Noted   Displaced intertrochanteric fracture of left femur, initial  encounter for closed fracture (HCC) 09/03/2023   Closed left femoral fracture (HCC) 09/02/2023   Osteoporosis 09/02/2023   Dyslipidemia 09/02/2023   Hypothyroidism 09/02/2023   Injury of cervical spine (HCC) 09/02/2023   Past Medical History:  Diagnosis Date   Complication of anesthesia    took a long time to wake up after a surgery. Doesn't remember which surgery it was   Coronary artery disease    Hypothyroidism    Thyroid disease     Family History  Problem Relation Age of Onset   Rheum arthritis Other     Past Surgical History:  Procedure Laterality Date   ABDOMINAL HYSTERECTOMY     APPENDECTOMY     INTRAMEDULLARY (IM) NAIL INTERTROCHANTERIC Left 09/03/2023   Procedure: FIXATION, FRACTURE, INTERTROCHANTERIC, WITH INTRAMEDULLARY ROD;  Surgeon: Jerri Kay HERO, MD;  Location: [facility] OR;  Service: Orthopedics;  Laterality: Left;   Social History   Occupational History   Not on file  Tobacco Use   Smoking status: Every Day    Current packs/day: 0.02    Average packs/day: (1.2 ttl pk-yrs)    Types: Cigarettes   Smokeless tobacco: Never  Vaping Use   Vaping status: Never Used  Substance and Sexual Activity   Alcohol use: Never   Drug use: Never   Sexual activity: Not Currently

## 2023-11-01 ENCOUNTER — Ambulatory Visit (INDEPENDENT_AMBULATORY_CARE_PROVIDER_SITE_OTHER): Admitting: Physician Assistant

## 2023-11-01 ENCOUNTER — Other Ambulatory Visit (INDEPENDENT_AMBULATORY_CARE_PROVIDER_SITE_OTHER)

## 2023-11-01 DIAGNOSIS — S72142A Displaced intertrochanteric fracture of left femur, initial encounter for closed fracture: Secondary | ICD-10-CM
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
## 2023-12-13 ENCOUNTER — Other Ambulatory Visit

## 2023-12-13 ENCOUNTER — Ambulatory Visit: Admitting: Physician Assistant

## 2023-12-13 DIAGNOSIS — S72142A Displaced intertrochanteric fracture of left femur, initial encounter for closed fracture: Secondary | ICD-10-CM

## 2023-12-13 MED ORDER — HYDROCODONE-ACETAMINOPHEN 5-325 MG PO TABS: ORAL_TABLET | ORAL | 0 refills | Status: AC

## 2023-12-13 NOTE — Progress Notes (Signed)
   Post-Op Visit Note   Patient: Gloria Massey           Date of Birth: 1940-10-29           MRN: [id number] Visit Date: 12/13/2023 PCP: Teresa Aldona CROME, NP   Assessment & Plan:  Chief Complaint:  Chief Complaint  Patient presents with   Left Hip - Pain    Left hip IM Nailing 09/03/2023     Visit Diagnoses:  1. Displaced intertrochanteric fracture of left femur, initial encounter for closed fracture [facility])     Plan: Patient is a pleasant 83 year old female who comes in today approximately 3 months status post left hip IM nail from an intertrochanteric femur fracture, date of surgery 09/03/2023.  She is still in a fair amount of pain although she notes mild improvement in symptoms.  She was ambulating without any assistive devices prior to her fracture.  She is currently ambulating with a walker.  She has finished physical therapy but continues to work on a home exercise program.  She is taking one half Norco tab daily for pain.  Examination of her left hip reveals mild discomfort with hip flexion and logroll.  She is neurovascularly intact distally.  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  We will reach out to [place] [employer] to try and get approval for a bone stimulator.  She will follow-up in 8 weeks for repeat evaluation and x-rays of the left femur.  I refilled her hydrocodone .  Call with concerns or questions.  Follow-Up Instructions: Return in about 8 weeks (around 02/07/2024).   Orders:  Orders Placed This Encounter  Procedures   XR FEMUR MIN 2 VIEWS LEFT   Meds ordered this encounter  Medications   HYDROcodone -acetaminophen  (NORCO/VICODIN) 5-325 MG tablet    Sig: Take 1/2 pill daily prn pain    Dispense:  20 tablet    Refill:  0    Imaging: No results found.   PMFS History: Patient Active Problem List   Diagnosis Date Noted   Displaced intertrochanteric fracture of left femur, initial encounter for closed fracture (HCC) 09/03/2023    Closed left femoral fracture (HCC) 09/02/2023   Osteoporosis 09/02/2023   Dyslipidemia 09/02/2023   Hypothyroidism 09/02/2023   Injury of cervical spine (HCC) 09/02/2023   Past Medical History:  Diagnosis Date   Complication of anesthesia    took a long time to wake up after a surgery. Doesn't remember which surgery it was   Coronary artery disease    Hypothyroidism    Thyroid disease     Family History  Problem Relation Age of Onset   Rheum arthritis Other     Past Surgical History:  Procedure Laterality Date   ABDOMINAL HYSTERECTOMY     APPENDECTOMY     INTRAMEDULLARY (IM) NAIL INTERTROCHANTERIC Left 09/03/2023   Procedure: FIXATION, FRACTURE, INTERTROCHANTERIC, WITH INTRAMEDULLARY ROD;  Surgeon: Jerri Kay HERO, MD;  Location: [facility] OR;  Service: Orthopedics;  Laterality: Left;   Social History   Occupational History   Not on file  Tobacco Use   Smoking status: Every Day    Current packs/day: 0.02    Average packs/day: (1.2 ttl pk-yrs)    Types: Cigarettes   Smokeless tobacco: Never  Vaping Use   Vaping status: Never Used  Substance and Sexual Activity   Alcohol use: Never   Drug use: Never   Sexual activity: Not Currently

## 2024-02-14 ENCOUNTER — Ambulatory Visit: Admitting: Orthopaedic Surgery

## 2024-02-14 ENCOUNTER — Other Ambulatory Visit (INDEPENDENT_AMBULATORY_CARE_PROVIDER_SITE_OTHER)

## 2024-02-14 DIAGNOSIS — S72142A Displaced intertrochanteric fracture of left femur, initial encounter for closed fracture: Secondary | ICD-10-CM

## 2024-02-14 NOTE — Progress Notes (Signed)
 "  Office Visit Note   Patient: Gloria Massey           Date of Birth: [birth year]           MRN: [id number] Visit Date: 02/14/2024              Requested by: Teresa Aldona CROME, NP [address],  [place] [address] PCP: Teresa Aldona CROME, NP   Assessment & Plan: Visit Diagnoses:  1. Displaced intertrochanteric fracture of left femur, initial encounter for closed fracture [facility])     Plan: History of Present Illness Gloria Massey is an 84 year old female here for follow-up for left hip fracture.  Son is present.  She has used a bone stimulator for 55 days for about one hour each morning. She finds the device too large and difficult to tighten but continues to use it as directed.  She feels her hip is improving and that recovery is progressing. She has increased left hip discomfort in cold and rainy weather, which she relates to the healing fracture and osteoarthritis.  She remains active with home exercises, including light weight lifting, marching, toe exercises, and regular household tasks such as mopping, cooking, and making beds, and feels she maintains good functional status.  Physical Exam MUSCULOSKELETAL: Mild tenderness on palpation of left hip.  Gentle range of motion of the hip is asymptomatic.  Walks with a cane.  Assessment and Plan Displaced reverse obliquity intertrochanteric fracture of left femur, healing Five months post-fracture with appropriate healing, improved symptoms, and functional recovery. Mild residual tenderness, no acute complications. - Continue bone stimulator until radiographic evidence of complete healing. - Encourage daily exercises and physical activity. - Repeat left hip/femur radiographs in three months to assess healing.  Left hip osteoarthritis Intermittent discomfort managed with current activity and medications. - Continue current management and activity level. - Contributing to some of her hip symptoms especially during  weather changes.  Follow-Up Instructions: Return in about 3 months (around 05/14/2024).   Orders:  Orders Placed This Encounter  Procedures   XR FEMUR MIN 2 VIEWS LEFT   No orders of the defined types were placed in this encounter.     Procedures: No procedures performed   Clinical Data: No additional findings.   Subjective: Chief Complaint  Patient presents with   Left Hip - Follow-up    Left hip IM rod 09/03/2023    HPI  Review of Systems  Constitutional: Negative.   HENT: Negative.    Eyes: Negative.   Respiratory: Negative.    Cardiovascular: Negative.   Endocrine: Negative.   Musculoskeletal: Negative.   Neurological: Negative.   Hematological: Negative.   Psychiatric/Behavioral: Negative.    All other systems reviewed and are negative.    Objective: Vital Signs: There were no vitals taken for this visit.  Physical Exam Vitals and nursing note reviewed.  Constitutional:      Appearance: She is well-developed.  HENT:     Head: Atraumatic.     Nose: Nose normal.  Eyes:     Extraocular Movements: Extraocular movements intact.  Cardiovascular:     Pulses: Normal pulses.  Pulmonary:     Effort: Pulmonary effort is normal.  Abdominal:     Palpations: Abdomen is soft.  Musculoskeletal:     Cervical back: Neck supple.  Skin:    General: Skin is warm.     Capillary Refill: Capillary refill takes less than 2 seconds.  Neurological:  Mental Status: She is alert. Mental status is at baseline.  Psychiatric:        Behavior: Behavior normal.        Thought Content: Thought content normal.        Judgment: Judgment normal.     Ortho Exam  Specialty Comments:  No specialty comments available.  Imaging: XR FEMUR MIN 2 VIEWS LEFT Result Date: 02/14/2024 X-rays demonstrate stable cephalomedullary fixation of a reverse obliquity fracture with evidence of continued fracture healing and consolidation.    PMFS History: Patient Active Problem List    Diagnosis Date Noted   Displaced intertrochanteric fracture of left femur, initial encounter for closed fracture (HCC) 09/03/2023   Closed left femoral fracture (HCC) 09/02/2023   Osteoporosis 09/02/2023   Dyslipidemia 09/02/2023   Hypothyroidism 09/02/2023   Injury of cervical spine (HCC) 09/02/2023   Past Medical History:  Diagnosis Date   Complication of anesthesia    took a long time to wake up after a surgery. Doesn't remember which surgery it was   Coronary artery disease    Hypothyroidism    Thyroid disease     Family History  Problem Relation Age of Onset   Rheum arthritis Other     Past Surgical History:  Procedure Laterality Date   ABDOMINAL HYSTERECTOMY     APPENDECTOMY     INTRAMEDULLARY (IM) NAIL INTERTROCHANTERIC Left 09/03/2023   Procedure: FIXATION, FRACTURE, INTERTROCHANTERIC, WITH INTRAMEDULLARY ROD;  Surgeon: Jerri Kay HERO, MD;  Location: [facility] OR;  Service: Orthopedics;  Laterality: Left;   Social History   Occupational History   Not on file  Tobacco Use   Smoking status: Every Day    Current packs/day: 0.02    Average packs/day: (1.2 ttl pk-yrs)    Types: Cigarettes   Smokeless tobacco: Never  Vaping Use   Vaping status: Never Used  Substance and Sexual Activity   Alcohol use: Never   Drug use: Never   Sexual activity: Not Currently        "

## 2024-05-17 ENCOUNTER — Ambulatory Visit: Admitting: Physician Assistant
